# Patient Record
Sex: Female | Born: 1980 | Race: White | Hispanic: No | Marital: Single | Smoking: Former smoker
Health system: Southern US, Community
[De-identification: ages and names within clinical notes are randomized; demographics above are authoritative.]

## PROBLEM LIST (undated history)

## (undated) DIAGNOSIS — F988 Other specified behavioral and emotional disorders with onset usually occurring in childhood and adolescence: Secondary | ICD-10-CM

## (undated) DIAGNOSIS — F329 Major depressive disorder, single episode, unspecified: Secondary | ICD-10-CM

## (undated) DIAGNOSIS — F32A Depression, unspecified: Secondary | ICD-10-CM

## (undated) DIAGNOSIS — O020 Blighted ovum and nonhydatidiform mole: Secondary | ICD-10-CM

## (undated) DIAGNOSIS — F419 Anxiety disorder, unspecified: Secondary | ICD-10-CM

## (undated) HISTORY — PX: CHOLECYSTECTOMY: SHX55

## (undated) HISTORY — PX: HYSTEROSCOPY WITH D & C: SHX1775

---
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
## 2018-04-15 DIAGNOSIS — F411 Generalized anxiety disorder: Secondary | ICD-10-CM | POA: Insufficient documentation

## 2018-04-15 DIAGNOSIS — F32A Depression, unspecified: Secondary | ICD-10-CM | POA: Insufficient documentation

## 2018-04-15 DIAGNOSIS — F988 Other specified behavioral and emotional disorders with onset usually occurring in childhood and adolescence: Secondary | ICD-10-CM | POA: Insufficient documentation

## 2018-04-15 DIAGNOSIS — K580 Irritable bowel syndrome with diarrhea: Secondary | ICD-10-CM | POA: Insufficient documentation

## 2018-12-07 ENCOUNTER — Encounter: Payer: Self-pay | Admitting: Emergency Medicine

## 2018-12-07 ENCOUNTER — Other Ambulatory Visit: Payer: Self-pay

## 2018-12-07 ENCOUNTER — Emergency Department
Admission: EM | Admit: 2018-12-07 | Discharge: 2018-12-07 | Disposition: A | Discharge status: Home / Self Care | Payer: BLUE CROSS/BLUE SHIELD | Source: Home / Self Care | Attending: Family Medicine | Admitting: Family Medicine

## 2018-12-07 DIAGNOSIS — K029 Dental caries, unspecified: Secondary | ICD-10-CM

## 2018-12-07 DIAGNOSIS — K0889 Other specified disorders of teeth and supporting structures: Secondary | ICD-10-CM

## 2018-12-07 DIAGNOSIS — K047 Periapical abscess without sinus: Secondary | ICD-10-CM

## 2018-12-07 HISTORY — DX: Depression, unspecified: F32.A

## 2018-12-07 HISTORY — DX: Other specified behavioral and emotional disorders with onset usually occurring in childhood and adolescence: F98.8

## 2018-12-07 HISTORY — DX: Anxiety disorder, unspecified: F41.9

## 2018-12-07 MED ORDER — CLINDAMYCIN HCL 300 MG PO CAPS: ORAL_CAPSULE | ORAL | 0 refills | Status: DC

## 2018-12-07 NOTE — Discharge Instructions (Addendum)
May continue Ibuprofen 200mg, 4 tabs every 8 hours with food.  °If symptoms become significantly worse during the night or over the weekend, proceed to the local emergency room.  °

## 2018-12-07 NOTE — ED Provider Notes (Signed)
[name]    CSN: [id number] Arrival date & time: 12/07/18  1134     History   Chief Complaint Chief Complaint  Patient presents with  . Dental Pain    HPI Alisha Grant is a 38 y.o. female.   Patient complains of one day history of pain in teeth in her right lower jaw.  She has had mild swelling in her face but no trismus, difficulty swallowing, of fevers, chills, and sweats.  She had similar tooth pain approximately two months ago, treated in the [facility] ED with clindamycin.  Her symptoms improved rapidly, but she did not seek follow-up care with a dentist.  The history is provided by the patient.  Dental Pain  Location:  Lower Lower teeth location:  27/RL cuspid, 28/RL 1st bicuspid and 29/RL 2nd bicuspid Quality:  Aching, constant and dull Severity:  Moderate Onset quality:  Sudden Duration:  1 day Timing:  Constant Progression:  Worsening Chronicity:  Recurrent Context: dental caries, dental fracture and poor dentition   Relieved by:  Nothing Worsened by:  Cold food/drink, hot food/drink and touching Ineffective treatments:  NSAIDs Associated symptoms: facial pain   Associated symptoms: no congestion, no difficulty swallowing, no drooling, no facial swelling, no fever, no gum swelling, no headaches, no neck pain, no neck swelling, no oral bleeding, no oral lesions and no trismus   Risk factors: lack of dental care and periodontal disease     Past Medical History:  Diagnosis Date  . ADD (attention deficit disorder)   . Anxiety and depression     There are no active problems to display for this patient.   Past Surgical History:  Procedure Laterality Date  . CHOLECYSTECTOMY      OB History   No obstetric history on file.      Home Medications    Prior to Admission medications   Medication Sig Start Date End Date Taking? Authorizing Provider  drospirenone-ethinyl estradiol (YAZ) 3-0.02 MG tablet Take 1 tablet by mouth  daily.   Yes [provider]  lisdexamfetamine (VYVANSE) 70 MG capsule Take 70 mg by mouth daily.   Yes [provider]  sertraline (ZOLOFT) 100 MG tablet Take 100 mg by mouth daily.   Yes [provider]  clindamycin (CLEOCIN) 300 MG capsule Take one cap PO every 8 hours 12/07/18   Lattie Haw, MD    Family History No family history on file.  Social History Social History   Tobacco Use  . Smoking status: Current Every Day Smoker  . Smokeless tobacco: Never Used  Substance Use Topics  . Alcohol use: Yes  . Drug use: Not on file     Allergies   Codeine and Penicillins   Review of Systems Review of Systems  Constitutional: Negative for chills, diaphoresis and fever.  HENT: Negative for congestion, drooling, facial swelling and mouth sores.   Musculoskeletal: Negative for neck pain.  Neurological: Negative for headaches.  All other systems reviewed and are negative.    Physical Exam Triage Vital Signs ED Triage Vitals  Enc Vitals Group     BP 12/07/18 1217 105/72     Pulse Rate 12/07/18 1217 100     Resp 12/07/18 1217 16     Temp 12/07/18 1217 98.6 F (37 C)     Temp src --      SpO2 12/07/18 1217 100 %     Weight 12/07/18 1218 215 lb (97.5 kg)  Height 12/07/18 1218 5' (1.524 m)     Head Circumference --      Peak Flow --      Pain Score 12/07/18 1217 9     Pain Loc --      Pain Edu? --      Excl. in [place]? --    No data found.  Updated Vital Signs BP 105/72 (BP Location: Right Arm)   Pulse 100   Temp 98.6 F (37 C)   Resp 16   Ht 5' (1.524 m)   Wt 97.5 kg   SpO2 100%   BMI 41.99 kg/m   Visual Acuity Right Eye Distance:   Left Eye Distance:   Bilateral Distance:    Right Eye Near:   Left Eye Near:    Bilateral Near:     Physical Exam Vitals signs and nursing note reviewed.  Constitutional:      General: She is not in acute distress. HENT:     Head: Normocephalic.     Jaw: Tenderness present.     Salivary  Glands: Right salivary gland is not tender. Left salivary gland is not tender.      Comments: There is tenderness to palpation right cheek as noted on diagram, but no swelling.     Right Ear: Tympanic membrane normal.     Left Ear: Tympanic membrane normal.     Nose: Nose normal.     Mouth/Throat:     Mouth: Mucous membranes are moist. No oral lesions.     Dentition: Abnormal dentition. Dental tenderness and dental caries present. No gingival swelling, dental abscesses or gum lesions.     Tongue: No lesions. Tongue does not deviate from midline.     Palate: No lesions.     Pharynx: Oropharynx is clear.      Comments: Diffuse dental caries present. Tooth #27 severely eroded and tender to tap. Teeth #28 & 29 completely eroded and tender to tap. Eyes:     Conjunctiva/sclera: Conjunctivae normal.     Pupils: Pupils are equal, round, and reactive to light.  Cardiovascular:     Heart sounds: Normal heart sounds.  Pulmonary:     Breath sounds: Normal breath sounds.  Lymphadenopathy:     Cervical: No cervical adenopathy.  Skin:    General: Skin is warm and dry.  Neurological:     Mental Status: She is alert and oriented to person, place, and time.      UC Treatments / Results  Labs (all labs ordered are listed, but only abnormal results are displayed) Labs Reviewed - No data to display  EKG None  Radiology No results found.  Procedures Procedures (including critical care time)  Medications Ordered in UC Medications - No data to display  Initial Impression / Assessment and Plan / UC Course  I have reviewed the triage vital signs and the nursing notes.  Pertinent labs & imaging results that were available during my care of the patient were reviewed by me and considered in my medical decision making (see chart for details).    Begin Clindamycin 300mg  Q8hr. Followup with dentist as soon as possible.    Final Clinical Impressions(s) / UC Diagnoses   Final diagnoses:   Pain, dental  Dental caries  Dental infection     Discharge Instructions     May continue Ibuprofen 200mg , 4 tabs every 8 hours with food.   If symptoms become significantly worse during the night or over the weekend, proceed to the  local emergency room.     ED Prescriptions    Medication Sig Dispense Auth. Provider   clindamycin (CLEOCIN) 300 MG capsule Take one cap PO every 8 hours 21 capLattie Hawtephen A, MD        Lattie Haw, MD 12/07/18 2033

## 2018-12-07 NOTE — ED Triage Notes (Signed)
Patient reports severe pain in lower right jaw area around teeth; has dental carries and missing teeth; she took ibuprofen 800mg  at 1100. She has not travelled.

## 2019-04-07 ENCOUNTER — Other Ambulatory Visit: Payer: Self-pay

## 2019-04-07 ENCOUNTER — Encounter: Payer: Self-pay | Admitting: Family Medicine

## 2019-04-07 ENCOUNTER — Emergency Department (INDEPENDENT_AMBULATORY_CARE_PROVIDER_SITE_OTHER)
Admission: EM | Admit: 2019-04-07 | Discharge: 2019-04-07 | Disposition: A | Discharge status: Home / Self Care | Payer: BLUE CROSS/BLUE SHIELD | Source: Home / Self Care

## 2019-04-07 DIAGNOSIS — J40 Bronchitis, not specified as acute or chronic: Secondary | ICD-10-CM

## 2019-04-07 DIAGNOSIS — J4521 Mild intermittent asthma with (acute) exacerbation: Secondary | ICD-10-CM

## 2019-04-07 MED ORDER — DEXAMETHASONE SODIUM PHOSPHATE 10 MG/ML IJ SOLN
10.0000 mg | Freq: Once | INTRAMUSCULAR | Status: AC
  Administered 2019-04-07: 10 mg via INTRAMUSCULAR

## 2019-04-07 MED ORDER — AZITHROMYCIN 250 MG PO TABS: ORAL_TABLET | ORAL | 0 refills | Status: DC

## 2019-04-07 NOTE — ED Provider Notes (Addendum)
[facility]    CSN: [id number] Arrival date & time: 04/07/19  1805      History   Chief Complaint Chief Complaint  Patient presents with  . URI    HPI Alisha Grant is a 38 y.o. female.   This is a 38 year old established [facility] urgent care patient who presents with cough and runny nose.  She has been out of work for 5  Months and living alone except for boyfriend.  No fever.  Patient has a h/o asthma.  She is complaining of sinus pressure and congestion.  No nausea, loss of sense of smell, myalgias.     Past Medical History:  Diagnosis Date  . ADD (attention deficit disorder)   . Anxiety and depression     There are no active problems to display for this patient.   Past Surgical History:  Procedure Laterality Date  . CHOLECYSTECTOMY      OB History   No obstetric history on file.      Home Medications    Prior to Admission medications   Medication Sig Start Date End Date Taking? Authorizing Provider  azithromycin (ZITHROMAX) 250 MG tablet Take 2 tabs PO x 1 dose, then 1 tab PO QD x 4 days 04/07/19   Robyn Haber, MD  drospirenone-ethinyl estradiol (YAZ) 3-0.02 MG tablet Take 1 tablet by mouth daily.    [provider]  lisdexamfetamine (VYVANSE) 70 MG capsule Take 70 mg by mouth daily.    [provider]  sertraline (ZOLOFT) 100 MG tablet Take 100 mg by mouth daily.    [provider]    Family History Family History  Problem Relation Age of Onset  . Cancer Mother   . COPD Mother   . Heart attack Father     Social History Social History   Tobacco Use  . Smoking status: Current Every Day Smoker  . Smokeless tobacco: Never Used  Substance Use Topics  . Alcohol use: Yes  . Drug use: Not on file     Allergies   Codeine and Penicillins   Review of Systems Review of Systems  Constitutional: Negative.   HENT: Positive for congestion and rhinorrhea.   Respiratory: Positive for cough and  wheezing.   All other systems reviewed and are negative.    Physical Exam Triage Vital Signs ED Triage Vitals  Enc Vitals Group     BP      Pulse      Resp      Temp      Temp src      SpO2      Weight      Height      Head Circumference      Peak Flow      Pain Score      Pain Loc      Pain Edu?      Excl. in [place]?    No data found.  Updated Vital Signs BP 122/76 (BP Location: Right Arm)   Pulse (!) 110   Temp 99.1 F (37.3 C) (Oral)   Ht 5' (1.524 m)   Wt 93.4 kg   SpO2 98%   BMI 40.23 kg/m    Physical Exam Vitals signs and nursing note reviewed.  Constitutional:      Appearance: Normal appearance. She is obese.  HENT:     Head: Normocephalic.     Right Ear: Tympanic membrane normal.     Left Ear: Tympanic membrane normal.  Nose: Congestion present.     Mouth/Throat:     Pharynx: Oropharynx is clear. Posterior oropharyngeal erythema present.  Eyes:     Conjunctiva/sclera: Conjunctivae normal.  Neck:     Musculoskeletal: Normal range of motion and neck supple.  Cardiovascular:     Rate and Rhythm: Normal rate and regular rhythm.     Heart sounds: Normal heart sounds. No murmur.  Pulmonary:     Effort: Pulmonary effort is normal.     Breath sounds: Wheezing and rhonchi present.  Musculoskeletal: Normal range of motion.  Skin:    General: Skin is warm and dry.  Neurological:     General: No focal deficit present.     Mental Status: She is alert and oriented to person, place, and time.  Psychiatric:        Mood and Affect: Mood normal.      UC Treatments / Results  Labs (all labs ordered are listed, but only abnormal results are displayed) Labs Reviewed - No data to display  EKG   Radiology No results found.  Procedures Procedures (including critical care time)  Medications Ordered in UC Medications  dexamethasone (DECADRON) injection 10 mg (has no administration in time range)    Initial Impression / Assessment and Plan / UC  Course  I have reviewed the triage vital signs and the nursing notes.  Pertinent labs & imaging results that were available during my care of the patient were reviewed by me and considered in my medical decision making (see chart for details).    Final Clinical Impressions(s) / UC Diagnoses   Final diagnoses:  Bronchitis  Mild intermittent asthma with acute exacerbation   Discharge Instructions   None    ED Prescriptions    Medication Sig Dispense Auth. Provider   azithromycin (ZITHROMAX) 250 MG tablet Take 2 tabs PO x 1 dose, then 1 tab PO QD x 4 days 6 tablet Elvina SidleLauenstein, Aleisa Howk, MD     Controlled Substance Prescriptions [place] Controlled Substance Registry consulted? Not Applicable   Elvina SidleLauenstein, Randall Colden, MD 04/07/19 Francesco [id number]    Alisha Bonk, MD 04/07/19 [phone]

## 2019-04-07 NOTE — ED Triage Notes (Signed)
Runny nose, productive cough, scratchy throat since yesterday

## 2019-04-10 ENCOUNTER — Telehealth: Payer: Self-pay | Admitting: Family Medicine

## 2019-04-10 MED ORDER — PREDNISONE 20 MG PO TABS: ORAL_TABLET | ORAL | 0 refills | Status: DC

## 2019-04-10 NOTE — Telephone Encounter (Signed)
Notified patient that medication was sent to pharmacy.  

## 2019-04-10 NOTE — Telephone Encounter (Signed)
Patient reports that she continues to cough, although improved.  She has not yet finished Z-pak. Plan: Begin prednisone burst/taper.  Finish Z-pak.

## 2019-04-15 ENCOUNTER — Telehealth: Payer: Self-pay | Admitting: Emergency Medicine

## 2019-04-15 NOTE — Telephone Encounter (Signed)
Patient called to see only minimal improvement; suggested she see her pcp or return for evaluation since last seen 04-07-19.

## 2019-04-24 ENCOUNTER — Emergency Department (INDEPENDENT_AMBULATORY_CARE_PROVIDER_SITE_OTHER)
Admission: EM | Admit: 2019-04-24 | Discharge: 2019-04-24 | Disposition: A | Discharge status: Home / Self Care | Payer: Self-pay | Source: Home / Self Care | Attending: Emergency Medicine | Admitting: Emergency Medicine

## 2019-04-24 ENCOUNTER — Emergency Department (INDEPENDENT_AMBULATORY_CARE_PROVIDER_SITE_OTHER): Payer: 59

## 2019-04-24 ENCOUNTER — Other Ambulatory Visit: Payer: Self-pay

## 2019-04-24 DIAGNOSIS — M25561 Pain in right knee: Secondary | ICD-10-CM

## 2019-04-24 DIAGNOSIS — M7651 Patellar tendinitis, right knee: Secondary | ICD-10-CM

## 2019-04-24 MED ORDER — MELOXICAM 7.5 MG PO TABS: ORAL_TABLET | ORAL | 0 refills | Status: DC

## 2019-04-24 NOTE — ED Provider Notes (Signed)
[facility]    CSN: [id number] Arrival date & time: 04/24/19  1052      History   Chief Complaint Chief Complaint  Patient presents with  . Knee Pain    HPI Alisha Grant is a 38 y.o. female.   HPI Patient took a new position last week at [employer] last week.  She has had to go up and down steps most of the day for the last 8 days.  She has pain and discomfort in her anterior right knee.  She had a previous injury to her knee 12 years ago.  At that time she wore a brace.  She has had no fever chills.  She has had no noticeable swelling.  The knee has not given way or locked on her.  She has been forced to walk with a limp. Past Medical History:  Diagnosis Date  . ADD (attention deficit disorder)   . Anxiety and depression     There are no active problems to display for this patient.   Past Surgical History:  Procedure Laterality Date  . CHOLECYSTECTOMY      OB History   No obstetric history on file.      Home Medications    Prior to Admission medications   Medication Sig Start Date End Date Taking? Authorizing Provider  azithromycin (ZITHROMAX) 250 MG tablet Take 2 tabs PO x 1 dose, then 1 tab PO QD x 4 days 04/07/19   Robyn Haber, MD  drospirenone-ethinyl estradiol (YAZ) 3-0.02 MG tablet Take 1 tablet by mouth daily.    [provider]  lisdexamfetamine (VYVANSE) 70 MG capsule Take 70 mg by mouth daily.    [provider]  meloxicam (MOBIC) 7.5 MG tablet Take 1 to 2 tablets daily for knee discomfort. 04/24/19   Darlyne Russian, MD  predniSONE (DELTASONE) 20 MG tablet Take one tab by mouth twice daily for 4 days, then one daily. Take with food. 04/10/19   Kandra Nicolas, MD  sertraline (ZOLOFT) 100 MG tablet Take 100 mg by mouth daily.    [provider]    Family History Family History  Problem Relation Age of Onset  . Cancer Mother   . COPD Mother   . Heart attack Father     Social History Social  History   Tobacco Use  . Smoking status: Current Every Day Smoker  . Smokeless tobacco: Never Used  Substance Use Topics  . Alcohol use: Yes  . Drug use: Not on file     Allergies   Codeine and Penicillins   Review of Systems Review of Systems  Constitutional: Negative.   Musculoskeletal: Positive for gait problem. Negative for back pain.       She has pain in the right knee without locking or giving way     Physical Exam Triage Vital Signs ED Triage Vitals  Enc Vitals Group     BP 04/24/19 1117 122/85     Pulse Rate 04/24/19 1117 (!) 103     Resp 04/24/19 1117 20     Temp 04/24/19 1117 98 F (36.7 C)     Temp Source 04/24/19 1117 Oral     SpO2 04/24/19 1117 97 %     Weight 04/24/19 1118 206 lb (93.4 kg)     Height 04/24/19 1118 5' (1.524 m)     Head Circumference --      Peak Flow --      Pain Score 04/24/19 1118  6     Pain Loc --      Pain Edu? --      Excl. in GC? --    No data found.  Updated Vital Signs BP 122/85 (BP Location: Right Arm)   Pulse (!) 103   Temp 98 F (36.7 C) (Oral)   Resp 20   Ht 5' (1.524 m)   Wt 93.4 kg   SpO2 97%   BMI 40.23 kg/m   Visual Acuity Right Eye Distance:   Left Eye Distance:   Bilateral Distance:    Right Eye Near:   Left Eye Near:    Bilateral Near:     Physical Exam Constitutional:      Comments: Patient is overweight and had difficulty getting up on the exam table.  Cardiovascular:     Rate and Rhythm: Normal rate.  Pulmonary:     Effort: Pulmonary effort is normal.  Musculoskeletal:     Comments: There is tenderness over the head of the fibula as well as tenderness over the patellar tendon with crepitation.  There is pain with extension of the right lower leg against resistance.  There is no ankle edema pulses in the right lower extremity are normal.  Neurological:     Mental Status: She is alert.      UC Treatments / Results  Labs (all labs ordered are listed, but only abnormal results are  displayed) Labs Reviewed - No data to display  EKG   Radiology Dg Knee Ap/lat W/sunrise Right  Result Date: 04/24/2019 CLINICAL DATA:  Tenderness and crepitation over the patellar region of the right knee. Patient tripped over her dog and fell. Anterior bruising. EXAM: RIGHT KNEE 3 VIEWS COMPARISON:  None. FINDINGS: No fracture or bone lesion. Joint normally spaced and aligned. No arthropathic changes. No joint effusion. Mild anterior subcutaneous soft tissue edema above the patella. No other soft tissue abnormality. IMPRESSION: No fracture or joint abnormality. Electronically Signed   By: Amie Portlandavid  Ormond M.D.   On: 04/24/2019 12:08    Procedures Procedures (including critical care time)  Medications Ordered in UC Medications - No data to display  Initial Impression / Assessment and Plan / UC Course  I have reviewed the triage vital signs and the nursing notes. I suspect the patient has developed a tendinitis of the patellar tendon.  She is overweight and has been going up and down steps.  There was crepitation over the tendon with flexion extension at the knee joint.  Will treat with anti-inflammatory medication as well as ice and rest.  I will give her a note to be excused from walking up and down steps for the next 2 weeks.  X-rays will be checked.  X-rays done were negative.  She felt much better in a knee brace.  She was given a note for light duty for a couple of weeks.  She was also instructed to be aware of GI upset and to stop the meloxicam if this develops. Pertinent labs & imaging results that were available during my care of the patient were reviewed by me and considered in my medical decision making (see chart for details).      Final Clinical Impressions(s) / UC Diagnoses   Final diagnoses:  Acute pain of right knee  Patellar tendinitis, right knee     Discharge Instructions     Take anti-inflammatory daily. If you develop any gastrointestinal upset stop your  medication and call the office. Apply ice to your knee twice  a day. Avoid steps for the next 2 weeks. Wear brace as needed.    ED Prescriptions    Medication Sig Dispense Auth. Provider   meloxicam (MOBIC) 7.5 MG tablet Take 1 to 2 tablets daily for knee discomfort. 20 tablet Collene Gobble, MD     PDMP not reviewed this encounter.   Collene Gobble, MD 04/24/19 1300

## 2019-04-24 NOTE — Discharge Instructions (Addendum)
Take anti-inflammatory daily. If you develop any gastrointestinal upset stop your medication and call the office. Apply ice to your knee twice a day. Avoid steps for the next 2 weeks. Wear brace as needed.

## 2019-04-24 NOTE — ED Triage Notes (Signed)
Pt fell on knee a couple years ago, and it "bothers" her at times.  She started a new job last Thursday, and is going up and down stairs, and on her feet all day, and the right knee has been causing pain for the last 24 or so hours.

## 2019-05-26 ENCOUNTER — Other Ambulatory Visit: Payer: Self-pay

## 2019-05-26 ENCOUNTER — Encounter: Payer: Self-pay | Admitting: *Deleted

## 2019-05-26 ENCOUNTER — Emergency Department
Admission: EM | Admit: 2019-05-26 | Discharge: 2019-05-26 | Disposition: A | Discharge status: Home / Self Care | Payer: Medicaid Other | Source: Home / Self Care | Attending: Family Medicine | Admitting: Family Medicine

## 2019-05-26 DIAGNOSIS — J029 Acute pharyngitis, unspecified: Secondary | ICD-10-CM

## 2019-05-26 DIAGNOSIS — J069 Acute upper respiratory infection, unspecified: Secondary | ICD-10-CM

## 2019-05-26 LAB — POCT RAPID STREP A (OFFICE): Rapid Strep A Screen: NEGATIVE

## 2019-05-26 MED ORDER — DOXYCYCLINE HYCLATE 100 MG PO CAPS: 100.0000 mg | ORAL_CAPSULE | Freq: Two times a day (BID) | ORAL | 0 refills | Status: DC

## 2019-05-26 MED ORDER — PREDNISONE 20 MG PO TABS: ORAL_TABLET | ORAL | 0 refills | Status: DC

## 2019-05-26 NOTE — ED Provider Notes (Signed)
[name]    CSN: [id number] Arrival date & time: 05/26/19  1306      History   Chief Complaint Chief Complaint  Patient presents with  . Sore Throat    HPI Alisha Grant is a 38 y.o. female.   Yesterday patient developed a scratchy throat, nasal congestion, and dry cough.  She has been mildly fatigued but denies fever, chest tightness/pain, shortness of breath, and changes in taste/smell.  She has a history of asthma and seasonal rhinitis, but has no improvement after starting Allegra yesterday.  She continues to smoke, and has had pneumonia in the past.  The history is provided by the patient.    Past Medical History:  Diagnosis Date  . ADD (attention deficit disorder)   . Anxiety and depression     There are no active problems to display for this patient.   Past Surgical History:  Procedure Laterality Date  . CHOLECYSTECTOMY      OB History   No obstetric history on file.      Home Medications    Prior to Admission medications   Medication Sig Start Date End Date Taking? Authorizing Provider  doxycycline (VIBRAMYCIN) 100 MG capsule Take 1 capsule (100 mg total) by mouth 2 (two) times daily. Take with food (Rx void after 06/02/19) 05/26/19   Lattie Haw, MD  drospirenone-ethinyl estradiol (YAZ) 3-0.02 MG tablet Take 1 tablet by mouth daily.    [provider]  lisdexamfetamine (VYVANSE) 70 MG capsule Take 70 mg by mouth daily.    [provider]  predniSONE (DELTASONE) 20 MG tablet Take one tab by mouth twice daily for 4 days, then one daily. Take with food. 05/26/19   Lattie Haw, MD  sertraline (ZOLOFT) 100 MG tablet Take 100 mg by mouth daily.    [provider]    Family History Family History  Problem Relation Age of Onset  . Cancer Mother   . COPD Mother   . Heart attack Father     Social History Social History   Tobacco Use  . Smoking status: Current Every Day Smoker  . Smokeless  tobacco: Never Used  Substance Use Topics  . Alcohol use: Yes  . Drug use: Not on file     Allergies   Codeine and Penicillins   Review of Systems Review of Systems + sore throat + cough No pleuritic pain No wheezing + nasal congestion + post-nasal drainage + sinus pain/pressure No itchy/red eyes No earache No hemoptysis No SOB No fever/chills No nausea No vomiting No abdominal pain No diarrhea No urinary symptoms No skin rash + fatigue No myalgias No headache Used OTC meds without relief   Physical Exam Triage Vital Signs ED Triage Vitals [05/26/19 1325]  Enc Vitals Group     BP (!) 142/94     Pulse Rate 87     Resp 16     Temp 98.4 F (36.9 C)     Temp Source Oral     SpO2 100 %     Weight 205 lb (93 kg)     Height 5' (1.524 m)     Head Circumference      Peak Flow      Pain Score 0     Pain Loc      Pain Edu?      Excl. in GC?    No data found.  Updated Vital Signs BP (!) 142/94 (BP Location: Right Arm)  Pulse 87   Temp 98.4 F (36.9 C) (Oral)   Resp 16   Ht 5' (1.524 m)   Wt 93 kg   SpO2 100%   BMI 40.04 kg/m   Visual Acuity Right Eye Distance:   Left Eye Distance:   Bilateral Distance:    Right Eye Near:   Left Eye Near:    Bilateral Near:     Physical Exam Nursing notes and Vital Signs reviewed. Appearance:  Patient appears stated age, and in no acute distress Eyes:  Pupils are equal, round, and reactive to light and accomodation.  Extraocular movement is intact.  Conjunctivae are not inflamed  Ears:  Canals normal.  Tympanic membranes normal.  Nose:  Mildly congested turbinates.  Maxillary sinus tenderness is present.  Pharynx:  Normal Neck:  Supple.  Shotty posterior/lateral nodes are palpated bilaterally, tender to palpation on the left.   Lungs:  Clear to auscultation.  Breath sounds are equal.  Moving air well. Heart:  Regular rate and rhythm without murmurs, rubs, or gallops.  Abdomen:  Nontender without masses or  hepatosplenomegaly.  Bowel sounds are present.  No CVA or flank tenderness.  Extremities:  No edema.  Skin:  No rash present.    UC Treatments / Results  Labs (all labs ordered are listed, but only abnormal results are displayed) Labs Reviewed  STREP A DNA PROBE  POCT RAPID STREP A (OFFICE)    EKG   Radiology No results found.  Procedures Procedures (including critical care time)  Medications Ordered in UC Medications - No data to display  Initial Impression / Assessment and Plan / UC Course  I have reviewed the triage vital signs and the nursing notes.  Pertinent labs & imaging results that were available during my care of the patient were reviewed by me and considered in my medical decision making (see chart for details).    There is no evidence of bacterial infection today.  Treat symptomatically for now  Begin prednisone burst/taper.  Because of her smoking history and past history of pneumonia, given an Rx to hold for doxycycline. Followup with Family Doctor if not improved in about 10 days.   Final Clinical Impressions(s) / UC Diagnoses   Final diagnoses:  Acute pharyngitis, unspecified etiology  Viral URI with cough     Discharge Instructions     Take plain guaifenesin (  extended release tabs such as Mucinex) twice daily, with plenty of water, for cough and congestion. Get adequate rest.   May use Afrin nasal spray (or generic oxymetazoline) each morning for about 5 days and then discontinue.  Also recommend using saline nasal spray several times daily and saline nasal irrigation (AYR is a common brand).  Use Flonase nasal spray each morning after using Afrin nasal spray and saline nasal irrigation. Try warm salt water gargles for sore throat.  Stop all antihistamines for now, and other non-prescription cough/cold preparations. Use albuterol inhaler as needed. May take Delsym Cough Suppressant at bedtime for nighttime cough.  Begin doxycycline if not  improving about one week or if persistent fever develops  (Given a prescription to hold, with an expiration date)     If symptoms become significantly worse during the night or over the weekend, proceed to the local emergency room.            ED Prescriptions    Medication Sig Dispense Auth. Provider   predniSONE (DELTASONE) 20 MG tablet Take one tab by mouth twice daily for 4 days,  then one daily. Take with food. 12 tablet Kandra Nicolas, MD   doxycycline (VIBRAMYCIN) 100 MG capsule Take 1 capsule (100 mg total) by mouth 2 (two) times daily. Take with food (Rx void after 06/02/19) 14 capsule Kandra Nicolas, MD        Kandra Nicolas, MD 05/26/19 [phone]

## 2019-05-26 NOTE — ED Triage Notes (Signed)
Pt c/o sore throat and facial pain x 2 days. Denies fever or SOB. Reports mild dry cough.

## 2019-05-26 NOTE — Discharge Instructions (Addendum)
Take plain guaifenesin (1200mg  extended release tabs such as Mucinex) twice daily, with plenty of water, for cough and congestion. Get adequate rest.   May use Afrin nasal spray (or generic oxymetazoline) each morning for about 5 days and then discontinue.  Also recommend using saline nasal spray several times daily and saline nasal irrigation (AYR is a common brand).  Use Flonase nasal spray each morning after using Afrin nasal spray and saline nasal irrigation. Try warm salt water gargles for sore throat.  Stop all antihistamines for now, and other non-prescription cough/cold preparations. Use albuterol inhaler as needed. May take Delsym Cough Suppressant at bedtime for nighttime cough.  Begin doxycycline if not improving about one week or if persistent fever develops   If symptoms become significantly worse during the night or over the weekend, proceed to the local emergency room.

## 2019-05-27 LAB — STREP A DNA PROBE: Group A Strep Probe: NOT DETECTED

## 2019-06-17 ENCOUNTER — Encounter: Payer: Self-pay | Admitting: Emergency Medicine

## 2019-06-17 ENCOUNTER — Other Ambulatory Visit: Payer: Self-pay

## 2019-06-17 ENCOUNTER — Emergency Department
Admission: EM | Admit: 2019-06-17 | Discharge: 2019-06-17 | Disposition: A | Discharge status: Home / Self Care | Payer: Medicaid Other | Source: Home / Self Care

## 2019-06-17 DIAGNOSIS — J029 Acute pharyngitis, unspecified: Secondary | ICD-10-CM | POA: Diagnosis not present

## 2019-06-17 DIAGNOSIS — Z6839 Body mass index (BMI) 39.0-39.9, adult: Secondary | ICD-10-CM | POA: Insufficient documentation

## 2019-06-17 DIAGNOSIS — Z20822 Contact with and (suspected) exposure to covid-19: Secondary | ICD-10-CM

## 2019-06-17 DIAGNOSIS — Z20828 Contact with and (suspected) exposure to other viral communicable diseases: Secondary | ICD-10-CM

## 2019-06-17 LAB — POCT RAPID STREP A (OFFICE): Rapid Strep A Screen: NEGATIVE

## 2019-06-17 NOTE — ED Triage Notes (Signed)
Sore throat since yesterday.

## 2019-06-17 NOTE — ED Provider Notes (Signed)
[facility]    CSN: [id number] Arrival date & time: 06/17/19  0858      History   Chief Complaint Chief Complaint  Patient presents with  . Sore Throat    HPI Alisha Grant is a 38 y.o. female.   HPI Alisha Grant is a 38 y.o. female presenting to UC with c/o a mild sore throat that started yesterday and mild nasal congestion.  Pt states her mother has also been sick recently with congestion, sore throat and low-grade fever. Her mother is currently being tested for Covid-19. Pt notes her aunt did test positive recently for Covid-19. Pt has not been around her aunt but has been in her house this past weekend.  She was also around her mother and states her son has had a fever and mild congestion but has not been tested for Covid yet.  Pt denies fever ,chills, n/v/d. No cough or chest congestion.    Past Medical History:  Diagnosis Date  . ADD (attention deficit disorder)   . Anxiety and depression     There are no active problems to display for this patient.   Past Surgical History:  Procedure Laterality Date  . CHOLECYSTECTOMY      OB History   No obstetric history on file.      Home Medications    Prior to Admission medications   Medication Sig Start Date End Date Taking? Authorizing Provider  lisdexamfetamine (VYVANSE) 70 MG capsule Take 70 mg by mouth daily.    [provider]  sertraline (ZOLOFT) 100 MG tablet Take 100 mg by mouth daily.    [provider]    Family History Family History  Problem Relation Age of Onset  . Cancer Mother   . COPD Mother   . Heart attack Father     Social History Social History   Tobacco Use  . Smoking status: Current Every Day Smoker    Types: Cigarettes  . Smokeless tobacco: Never Used  Substance Use Topics  . Alcohol use: Yes  . Drug use: Not on file     Allergies   Codeine and Penicillins   Review of Systems Review of Systems  Constitutional: Negative for  chills and fever.  HENT: Positive for congestion (mild) and sore throat. Negative for ear pain, trouble swallowing and voice change.   Respiratory: Negative for cough and shortness of breath.   Cardiovascular: Negative for chest pain and palpitations.  Gastrointestinal: Negative for abdominal pain, diarrhea, nausea and vomiting.  Musculoskeletal: Negative for arthralgias, back pain and myalgias.  Skin: Negative for rash.  Neurological: Negative for dizziness, light-headedness and headaches.     Physical Exam Triage Vital Signs ED Triage Vitals  Enc Vitals Group     BP 06/17/19 0919 115/79     Pulse Rate 06/17/19 0919 82     Resp --      Temp 06/17/19 0919 98.1 F (36.7 C)     Temp Source 06/17/19 0919 Oral     SpO2 06/17/19 0919 100 %     Weight 06/17/19 0920 201 lb (91.2 kg)     Height 06/17/19 0920 5' (1.524 m)     Head Circumference --      Peak Flow --      Pain Score 06/17/19 0920 5     Pain Loc --      Pain Edu? --      Excl. in [place]? --    No data found.  Updated Vital Signs BP 115/79 (BP Location: Right Arm)   Pulse 82   Temp 98.1 F (36.7 C) (Oral)   Ht 5' (1.524 m)   Wt 201 lb (91.2 kg)   SpO2 100%   BMI 39.26 kg/m   Visual Acuity Right Eye Distance:   Left Eye Distance:   Bilateral Distance:    Right Eye Near:   Left Eye Near:    Bilateral Near:     Physical Exam Vitals signs and nursing note reviewed.  Constitutional:      Appearance: She is well-developed.  HENT:     Head: Normocephalic and atraumatic.     Right Ear: Tympanic membrane and ear canal normal.     Left Ear: Tympanic membrane and ear canal normal.     Nose: Nose normal.     Right Sinus: No maxillary sinus tenderness or frontal sinus tenderness.     Left Sinus: No maxillary sinus tenderness or frontal sinus tenderness.     Mouth/Throat:     Lips: Pink.     Mouth: Mucous membranes are moist.     Pharynx: Oropharynx is clear. Uvula midline. Posterior oropharyngeal erythema  present. No pharyngeal swelling or uvula swelling.     Tonsils: No tonsillar exudate.  Neck:     Musculoskeletal: Normal range of motion and neck supple.  Cardiovascular:     Rate and Rhythm: Normal rate and regular rhythm.  Pulmonary:     Effort: Pulmonary effort is normal. No respiratory distress.     Breath sounds: Normal breath sounds.  Musculoskeletal: Normal range of motion.  Lymphadenopathy:     Cervical: No cervical adenopathy.  Skin:    General: Skin is warm and dry.  Neurological:     Mental Status: She is alert and oriented to person, place, and time.  Psychiatric:        Behavior: Behavior normal.      UC Treatments / Results  Labs (all labs ordered are listed, but only abnormal results are displayed) Labs Reviewed  SARS-COV-2 RNA,(COVID-19) QUALITATIVE NAAT  STREP A DNA PROBE  POCT RAPID STREP A (OFFICE)    EKG   Radiology No results found.  Procedures Procedures (including critical care time)  Medications Ordered in UC Medications - No data to display  Initial Impression / Assessment and Plan / UC Course  I have reviewed the triage vital signs and the nursing notes.  Pertinent labs & imaging results that were available during my care of the patient were reviewed by me and considered in my medical decision making (see chart for details).     Rapid strep: NEGATIVE Culture sent Send out Covid-19 testing: pending AVS provided  Final Clinical Impressions(s) / UC Diagnoses   Final diagnoses:  Acute pharyngitis, unspecified etiology  Exposure to COVID-19 virus     Discharge Instructions      You may take 500mg  acetaminophen every 4-6 hours or in combination with ibuprofen 400-600mg  every 6-8 hours as needed for pain, inflammation, and fever.  Be sure to well hydrated with clear liquids and get at least 8 hours of sleep at night, preferably more while sick.   Please follow up with family medicine in 1 week if needed.   Due to concern for  possibly having Covid-19, it is advised that you self-isolate at home until test results come back in 2-3 days.  If positive, it is recommended you stay isolated for at least 10 days after symptom onset and 24 hours after last  fever without taking medication (whichever is longer), with improving symptoms.  If you MUST go out, please wear a mask at all times, limit contact with others.      ED Prescriptions    None     PDMP not reviewed this encounter.   Lurene Shadowhelps, Tarez Bowns O, PA-C 06/17/19 1227

## 2019-06-17 NOTE — Discharge Instructions (Signed)
°  You may take 500mg acetaminophen every 4-6 hours or in combination with ibuprofen 400-600mg every 6-8 hours as needed for pain, inflammation, and fever. ° °Be sure to well hydrated with clear liquids and get at least 8 hours of sleep at night, preferably more while sick.  ° °Please follow up with family medicine in 1 week if needed. ° ° °Due to concern for possibly having Covid-19, it is advised that you self-isolate at home until test results come back in 2-3 days.  If positive, it is recommended you stay isolated for at least 10 days after symptom onset and 24 hours after last fever without taking medication (whichever is longer), with improving symptoms.  If you MUST go out, please wear a mask at all times, limit contact with others.  ° °

## 2019-06-19 LAB — STREP A DNA PROBE: Group A Strep Probe: NOT DETECTED

## 2019-06-21 LAB — SARS-COV-2 RNA,(COVID-19) QUALITATIVE NAAT: SARS CoV2 RNA: NOT DETECTED

## 2019-07-07 ENCOUNTER — Emergency Department
Admission: EM | Admit: 2019-07-07 | Discharge: 2019-07-07 | Disposition: A | Discharge status: Home / Self Care | Payer: 59 | Source: Home / Self Care

## 2019-07-07 ENCOUNTER — Other Ambulatory Visit: Payer: Self-pay

## 2019-07-07 ENCOUNTER — Encounter: Payer: Self-pay | Admitting: Emergency Medicine

## 2019-07-07 DIAGNOSIS — J029 Acute pharyngitis, unspecified: Secondary | ICD-10-CM | POA: Diagnosis not present

## 2019-07-07 LAB — POCT RAPID STREP A (OFFICE): Rapid Strep A Screen: NEGATIVE

## 2019-07-07 NOTE — ED Triage Notes (Signed)
Sore throat, runny nose, cough, started 3 days

## 2019-07-07 NOTE — ED Provider Notes (Signed)
[name]    CSN: [id number] Arrival date & time: 07/07/19  1028      History   Chief Complaint Chief Complaint  Patient presents with  . Sore Throat    HPI Alisha Grant is a 38 y.o. female.   The history is provided by the patient. No language interpreter was used.  Sore Throat This is a new problem. The problem occurs constantly. The problem has not changed since onset.Nothing aggravates the symptoms. Nothing relieves the symptoms. She has tried nothing for the symptoms. The treatment provided no relief.    Past Medical History:  Diagnosis Date  . ADD (attention deficit disorder)   . Anxiety and depression     There are no active problems to display for this patient.   Past Surgical History:  Procedure Laterality Date  . CHOLECYSTECTOMY      OB History   No obstetric history on file.      Home Medications    Prior to Admission medications   Medication Sig Start Date End Date Taking? Authorizing Provider  lisdexamfetamine (VYVANSE) 70 MG capsule Take 70 mg by mouth daily.    [provider]  sertraline (ZOLOFT) 100 MG tablet Take 100 mg by mouth daily.    [provider]    Family History Family History  Problem Relation Age of Onset  . Cancer Mother   . COPD Mother   . Heart attack Father     Social History Social History   Tobacco Use  . Smoking status: Current Every Day Smoker    Packs/day: 1.00    Types: Cigarettes  . Smokeless tobacco: Never Used  Substance Use Topics  . Alcohol use: Yes  . Drug use: Not on file     Allergies   Codeine and Penicillins   Review of Systems Review of Systems  All other systems reviewed and are negative.    Physical Exam Triage Vital Signs ED Triage Vitals  Enc Vitals Group     BP 07/07/19 1118 118/74     Pulse Rate 07/07/19 1118 97     Resp --      Temp 07/07/19 1118 98.6 F (37 C)     Temp Source 07/07/19 1118 Oral     SpO2 07/07/19 1118 99 %   Weight 07/07/19 1119 206 lb (93.4 kg)     Height 07/07/19 1119 5' (1.524 m)     Head Circumference --      Peak Flow --      Pain Score 07/07/19 1119 5     Pain Loc --      Pain Edu? --      Excl. in GC? --    No data found.  Updated Vital Signs BP 118/74 (BP Location: Right Arm)   Pulse 97   Temp 98.6 F (37 C) (Oral)   Ht 5' (1.524 m)   Wt 93.4 kg   SpO2 99%   BMI 40.23 kg/m   Visual Acuity Right Eye Distance:   Left Eye Distance:   Bilateral Distance:    Right Eye Near:   Left Eye Near:    Bilateral Near:     Physical Exam Vitals signs and nursing note reviewed.  Constitutional:      Appearance: She is well-developed.  HENT:     Head: Normocephalic.     Right Ear: Tympanic membrane normal.     Left Ear: Tympanic membrane normal.     Mouth/Throat:  Pharynx: Pharyngeal swelling present.  Eyes:     Conjunctiva/sclera: Conjunctivae normal.  Neck:     Musculoskeletal: Normal range of motion.  Cardiovascular:     Rate and Rhythm: Normal rate.  Pulmonary:     Effort: Pulmonary effort is normal.  Abdominal:     General: Bowel sounds are normal. There is no distension.     Palpations: Abdomen is soft.  Musculoskeletal: Normal range of motion.  Skin:    General: Skin is warm.  Neurological:     General: No focal deficit present.     Mental Status: She is alert and oriented to person, place, and time.      UC Treatments / Results  Labs (all labs ordered are listed, but only abnormal results are displayed) Labs Reviewed  STREP A DNA PROBE  POCT RAPID STREP A (OFFICE)    EKG   Radiology No results found.  Procedures Procedures (including critical care time)  Medications Ordered in UC Medications - No data to display  Initial Impression / Assessment and Plan / UC Course  I have reviewed the triage vital signs and the nursing notes.  Pertinent labs & imaging results that were available during my care of the patient were reviewed by me and  considered in my medical decision making (see chart for details).     MDM Strep negative  Final Clinical Impressions(s) / UC Diagnoses   Final diagnoses:  Acute pharyngitis, unspecified etiology  Pharyngitis, unspecified etiology   Discharge Instructions   None    ED Prescriptions    None     PDMP not reviewed this encounter.  An After Visit Summary was printed and given to the patient.   Fransico Meadow, [place] 07/07/19 1154

## 2019-07-08 LAB — STREP A DNA PROBE: Group A Strep Probe: NOT DETECTED

## 2019-09-04 ENCOUNTER — Emergency Department
Admission: EM | Admit: 2019-09-04 | Discharge: 2019-09-04 | Disposition: A | Discharge status: Home / Self Care | Payer: Medicaid Other | Source: Home / Self Care

## 2019-09-04 ENCOUNTER — Other Ambulatory Visit: Payer: Self-pay

## 2019-09-04 DIAGNOSIS — J320 Chronic maxillary sinusitis: Secondary | ICD-10-CM | POA: Diagnosis not present

## 2019-09-04 DIAGNOSIS — R0981 Nasal congestion: Secondary | ICD-10-CM | POA: Diagnosis not present

## 2019-09-04 MED ORDER — CEFDINIR 300 MG PO CAPS: 300.0000 mg | ORAL_CAPSULE | Freq: Two times a day (BID) | ORAL | 0 refills | Status: AC

## 2019-09-04 NOTE — ED Provider Notes (Signed)
[facility]    CSN: [id number] Arrival date & time: 09/04/19  1312      History   Chief Complaint Chief Complaint  Patient presents with  . Jaw Pain    HPI Alisha Grant is a 39 y.o. female.   39 year old female, with history of ADD, anxiety, depression, presenting today complaining of suspected sinusitis.  Patient states that for the past 3 days or so, she has had congestion that is worse with lying flat.  States that it feels as though she has a toothache.  States that when she stands up, the congestion resolves and she then has postnasal drip.  States that she is had similar symptoms in the past and was treated for sinusitis.  States that she is currently [redacted] weeks pregnant and was unsure what she was able to take at home.  She denies any recent sick contacts or Covid exposure.  No loss of taste or smell.  The history is provided by the patient.  URI Presenting symptoms: congestion and facial pain   Presenting symptoms: no cough, no ear pain, no fatigue, no fever, no rhinorrhea and no sore throat   Severity:  Mild Onset quality:  Gradual Timing:  Constant Progression:  Unchanged Chronicity:  New Relieved by:  Nothing Worsened by:  Nothing Ineffective treatments:  None tried Associated symptoms: sinus pain   Associated symptoms: no arthralgias, no headaches, no myalgias, no neck pain, no sneezing, no swollen glands and no wheezing   Risk factors: not elderly, no chronic cardiac disease, no chronic kidney disease, no chronic respiratory disease, no diabetes mellitus, no immunosuppression, no recent illness, no recent travel and no sick contacts     Past Medical History:  Diagnosis Date  . ADD (attention deficit disorder)   . Anxiety and depression     There are no problems to display for this patient.   Past Surgical History:  Procedure Laterality Date  . CHOLECYSTECTOMY      OB History    Gravida  1   Para      Term      Preterm      AB        Living        SAB      TAB      Ectopic      Multiple      Live Births               Home Medications    Prior to Admission medications   Medication Sig Start Date End Date Taking? Authorizing Provider  Prenatal Multivit-Min-Fe-FA (PRE-NATAL FORMULA) TABS Take by mouth.   Yes [provider]  cefdinir (OMNICEF) 300 MG capsule Take 1 capsule (300 mg total) by mouth 2 (two) times daily for 10 days. 09/04/19 09/14/19  Kathlyne Loud C, PA-C  lisdexamfetamine (VYVANSE) 70 MG capsule Take 70 mg by mouth daily.    [provider]  sertraline (ZOLOFT) 100 MG tablet Take 100 mg by mouth daily.    [provider]    Family History Family History  Problem Relation Age of Onset  . Cancer Mother   . COPD Mother   . Heart attack Father     Social History Social History   Tobacco Use  . Smoking status: Smoker, Current Status Unknown    Packs/day: 1.00    Types: Cigarettes  . Smokeless tobacco: Never Used  . Tobacco comment: not currently  Substance Use Topics  . Alcohol  use: Yes  . Drug use: Not on file     Allergies   Codeine and Penicillins   Review of Systems Review of Systems  Constitutional: Negative for chills, fatigue and fever.  HENT: Positive for congestion and sinus pain. Negative for ear pain, rhinorrhea, sneezing and sore throat.   Eyes: Negative for pain and visual disturbance.  Respiratory: Negative for cough, shortness of breath and wheezing.   Cardiovascular: Negative for chest pain and palpitations.  Gastrointestinal: Negative for abdominal pain and vomiting.  Genitourinary: Negative for dysuria and hematuria.  Musculoskeletal: Negative for arthralgias, back pain, myalgias and neck pain.  Skin: Negative for color change and rash.  Neurological: Negative for seizures, syncope and headaches.  All other systems reviewed and are negative.    Physical Exam Triage Vital Signs ED Triage Vitals [09/04/19 1330]  Enc  Vitals Group     BP 118/77     Pulse Rate 88     Resp 18     Temp 97.9 F (36.6 C)     Temp Source Oral     SpO2 100 %     Weight 203 lb (92.1 kg)     Height 5' (1.524 m)     Head Circumference      Peak Flow      Pain Score      Pain Loc      Pain Edu?      Excl. in GC?    No data found.  Updated Vital Signs BP 118/77 (BP Location: Right Arm)   Pulse 88   Temp 97.9 F (36.6 C) (Oral)   Resp 18   Ht 5' (1.524 m)   Wt 203 lb (92.1 kg)   SpO2 100%   BMI 39.65 kg/m   Visual Acuity Right Eye Distance:   Left Eye Distance:   Bilateral Distance:    Right Eye Near:   Left Eye Near:    Bilateral Near:     Physical Exam Vitals and nursing note reviewed.  Constitutional:      General: She is not in acute distress.    Appearance: She is well-developed.  HENT:     Head: Normocephalic and atraumatic.     Nose:     Right Sinus: Maxillary sinus tenderness present.     Left Sinus: Maxillary sinus tenderness present.     Mouth/Throat:     Dentition: Abnormal dentition.     Pharynx: Oropharynx is clear.  Eyes:     Conjunctiva/sclera: Conjunctivae normal.  Cardiovascular:     Rate and Rhythm: Normal rate and regular rhythm.     Heart sounds: No murmur.  Pulmonary:     Effort: Pulmonary effort is normal. No respiratory distress.     Breath sounds: Normal breath sounds.  Abdominal:     Palpations: Abdomen is soft.     Tenderness: There is no abdominal tenderness.  Musculoskeletal:     Cervical back: Neck supple.  Skin:    General: Skin is warm and dry.  Neurological:     Mental Status: She is alert.      UC Treatments / Results  Labs (all labs ordered are listed, but only abnormal results are displayed) Labs Reviewed  NOVEL CORONAVIRUS, NAA    EKG   Radiology No results found.  Procedures Procedures (including critical care time)  Medications Ordered in UC Medications - No data to display  Initial Impression / Assessment and Plan / UC Course  I  have reviewed the  triage vital signs and the nursing notes.  Pertinent labs & imaging results that were available during my care of the patient were reviewed by me and considered in my medical decision making (see chart for details).     Suspected sinusitis based on exam and history.  Patient has a penicillin allergy but has taken cephalosporins in the past.  Will treat with Omnicef.  She is currently [redacted] weeks pregnant.  She was given a handout of medications that were safe during pregnancy as far as decongestants.  Send out Covid swab pending. Final Clinical Impressions(s) / UC Diagnoses   Final diagnoses:  Nasal congestion  Chronic maxillary sinusitis   Discharge Instructions   None    ED Prescriptions    Medication Sig Dispense Auth. Provider   cefdinir (OMNICEF) 300 MG capsule Take 1 capsule (300 mg total) by mouth 2 (two) times daily for 10 days. 20 capsule Keeghan Bialy C, PA-C     PDMP not reviewed this encounter.   Alecia Lemming, [place] 09/04/19 1402

## 2019-09-04 NOTE — ED Triage Notes (Signed)
Pt c/o upper lt jaw pain. Says it feels like a toothache, but last time she had similar pain, it ended up being a sinus infection. Pt is currently [redacted] weeks pregnant and not sure what she can take. Pain 5/10

## 2019-09-05 LAB — NOVEL CORONAVIRUS, NAA: SARS-CoV-2, NAA: NOT DETECTED

## 2019-09-29 DIAGNOSIS — O020 Blighted ovum and nonhydatidiform mole: Secondary | ICD-10-CM | POA: Insufficient documentation

## 2020-03-13 ENCOUNTER — Encounter: Payer: Self-pay | Admitting: Emergency Medicine

## 2020-03-13 ENCOUNTER — Other Ambulatory Visit: Payer: Self-pay

## 2020-03-13 ENCOUNTER — Emergency Department (INDEPENDENT_AMBULATORY_CARE_PROVIDER_SITE_OTHER)
Admission: EM | Admit: 2020-03-13 | Discharge: 2020-03-13 | Disposition: A | Discharge status: Home / Self Care | Payer: Medicaid Other | Source: Home / Self Care | Attending: Family Medicine | Admitting: Family Medicine

## 2020-03-13 DIAGNOSIS — K0889 Other specified disorders of teeth and supporting structures: Secondary | ICD-10-CM

## 2020-03-13 HISTORY — DX: Blighted ovum and nonhydatidiform mole: O02.0

## 2020-03-13 MED ORDER — CLINDAMYCIN HCL 300 MG PO CAPS: ORAL_CAPSULE | ORAL | 0 refills | Status: DC

## 2020-03-13 NOTE — ED Provider Notes (Signed)
[name]    CSN: [id number] Arrival date & time: 03/13/20  1503      History   Chief Complaint Chief Complaint  Patient presents with  . Facial Pain    HPI Alisha Grant is a 39 y.o. female.   Patient believes that she may have a sinus infection.  She complains of sinus congestion for about 4 days.  Yesterday she developed a pressure sensation in the left side of her face.  She has a painful broken left upper tooth.  She denies difficulty swallowing and fevers, chills, and sweats.  The history is provided by the patient.    Past Medical History:  Diagnosis Date  . ADD (attention deficit disorder)   . Anxiety and depression   . Molar pregnancy     There are no problems to display for this patient.   Past Surgical History:  Procedure Laterality Date  . CHOLECYSTECTOMY      OB History    Gravida  1   Para      Term      Preterm      AB      Living        SAB      TAB      Ectopic      Multiple      Live Births               Home Medications    Prior to Admission medications   Medication Sig Start Date End Date Taking? Authorizing Provider  escitalopram (LEXAPRO) 10 MG tablet Take 10 mg by mouth daily.   Yes [provider]  norethindrone-ethinyl estradiol (LOESTRIN FE) 1-20 MG-MCG tablet Take 1 tablet by mouth daily.   Yes [provider]  traZODone (DESYREL) 50 MG tablet Take 50 mg by mouth at bedtime.   Yes [provider]  clindamycin (CLEOCIN) 300 MG capsule Take one cap PO every 8 hours 03/13/20   Lattie Haw, MD  lisdexamfetamine (VYVANSE) 70 MG capsule Take 70 mg by mouth daily.    [provider]  Prenatal Multivit-Min-Fe-FA (PRE-NATAL FORMULA) TABS Take by mouth.    [provider]  sertraline (ZOLOFT) 100 MG tablet Take 100 mg by mouth daily.    [provider]    Family History Family History  Problem Relation Age of Onset  . Cancer Mother   .  COPD Mother   . Heart attack Father     Social History Social History   Tobacco Use  . Smoking status: Smoker, Current Status Unknown    Packs/day: 1.00    Types: Cigarettes  . Smokeless tobacco: Never Used  . Tobacco comment: not currently  Vaping Use  . Vaping Use: Every day  . Substances: Nicotine, Flavoring  Substance Use Topics  . Alcohol use: Yes  . Drug use: Not on file     Allergies   Codeine and Penicillins   Review of Systems Review of Systems  Constitutional: Negative for activity change, chills, diaphoresis, fatigue and fever.  HENT: Positive for congestion, dental problem, facial swelling, sinus pressure and sinus pain. Negative for ear pain, rhinorrhea, sore throat and trouble swallowing.   Eyes: Negative.   Respiratory: Negative.   Cardiovascular: Negative.   Gastrointestinal: Negative.   Genitourinary: Negative.   Musculoskeletal: Negative.   Skin: Negative.   Neurological: Negative.      Physical Exam Triage Vital Signs ED Triage Vitals  Enc Vitals Group  BP 03/13/20 1619 (!) 144/81     Pulse Rate 03/13/20 1619 84     Resp 03/13/20 1619 16     Temp 03/13/20 1619 98.5 F (36.9 C)     Temp Source 03/13/20 1619 Oral     SpO2 03/13/20 1619 98 %     Weight 03/13/20 1620 225 lb (102.1 kg)     Height 03/13/20 1620 5' (1.524 m)     Head Circumference --      Peak Flow --      Pain Score 03/13/20 1620 5     Pain Loc --      Pain Edu? --      Excl. in GC? --    No data found.  Updated Vital Signs BP (!) 144/81 (BP Location: Right Wrist)   Pulse 84   Temp 98.5 F (36.9 C) (Oral)   Resp 16   Ht 5' (1.524 m)   Wt 102.1 kg   SpO2 98%   Breastfeeding Unknown Comment: no period since February 2021; not pregnant as of 02/17/20  BMI 43.94 kg/m   Visual Acuity Right Eye Distance:   Left Eye Distance:   Bilateral Distance:    Right Eye Near:   Left Eye Near:    Bilateral Near:     Physical Exam Vitals and nursing note reviewed.    Constitutional:      General: She is not in acute distress. HENT:     Head: Normocephalic.     Right Ear: Tympanic membrane and ear canal normal.     Left Ear: Tympanic membrane and ear canal normal.     Nose: Nose normal.     Mouth/Throat:     Mouth: Mucous membranes are moist.     Dentition: Abnormal dentition. Dental tenderness, gingival swelling and dental caries present.     Pharynx: Oropharynx is clear.      Comments: Tooth #10 has tenderness to tap and tender inflamed surrounding gingiva. Eyes:     Conjunctiva/sclera: Conjunctivae normal.     Pupils: Pupils are equal, round, and reactive to light.  Cardiovascular:     Rate and Rhythm: Normal rate.  Pulmonary:     Effort: Pulmonary effort is normal.  Musculoskeletal:     Cervical back: Neck supple.  Lymphadenopathy:     Cervical: No cervical adenopathy.  Skin:    General: Skin is warm and dry.  Neurological:     Mental Status: She is alert and oriented to person, place, and time.      UC Treatments / Results  Labs (all labs ordered are listed, but only abnormal results are displayed) Labs Reviewed - No data to display  EKG   Radiology No results found.  Procedures Procedures (including critical care time)  Medications Ordered in UC Medications - No data to display  Initial Impression / Assessment and Plan / UC Course  I have reviewed the triage vital signs and the nursing notes.  Pertinent labs & imaging results that were available during my care of the patient were reviewed by me and considered in my medical decision making (see chart for details).    Suspect dental abscess; ?sinusitis.  Begin clindamycin 300mg  Q8hr.  Followup with dentist as soon as possible. Final Clinical Impressions(s) / UC Diagnoses   Final diagnoses:  Pain, dental     Discharge Instructions     May continue Ibuprofen 600mg  every 8 hours with food.   If symptoms become significantly worse during the night  or over the  weekend, proceed to the local emergency room.     ED Prescriptions    Medication Sig Dispense Auth. Provider   clindamycin (CLEOCIN) 300 MG capsule Take one cap PO every 8 hours 21 capsule Lattie Haw, MD        Lattie Haw, MD 03/20/20 [phone]

## 2020-03-13 NOTE — Discharge Instructions (Addendum)
May continue Ibuprofen 600mg  every 8 hours with food.   If symptoms become significantly worse during the night or over the weekend, proceed to the local emergency room.

## 2020-03-13 NOTE — ED Triage Notes (Signed)
Patient states she thinks she may have sinus infection: 4 days of congestion and now pressure left side of face; also has broken tooth on left upper back jaw area. Took ibuprofen 600 mg at 1400; also using heating pad. Has not had covid vaccination.

## 2020-05-05 ENCOUNTER — Other Ambulatory Visit: Payer: Self-pay

## 2020-05-05 ENCOUNTER — Emergency Department (INDEPENDENT_AMBULATORY_CARE_PROVIDER_SITE_OTHER)
Admission: RE | Admit: 2020-05-05 | Discharge: 2020-05-05 | Disposition: A | Discharge status: Home / Self Care | Payer: Medicaid Other | Source: Ambulatory Visit

## 2020-05-05 ENCOUNTER — Emergency Department (INDEPENDENT_AMBULATORY_CARE_PROVIDER_SITE_OTHER): Payer: Medicaid Other

## 2020-05-05 VITALS — BP 124/84 | HR 98 | Temp 99.1°F | Resp 17 | Ht 60.0 in | Wt 235.0 lb

## 2020-05-05 DIAGNOSIS — R0781 Pleurodynia: Secondary | ICD-10-CM

## 2020-05-05 DIAGNOSIS — S2231XA Fracture of one rib, right side, initial encounter for closed fracture: Secondary | ICD-10-CM | POA: Diagnosis not present

## 2020-05-05 MED ORDER — HYDROCODONE-ACETAMINOPHEN 5-325 MG PO TABS: 1.0000 | ORAL_TABLET | Freq: Four times a day (QID) | ORAL | 0 refills | Status: DC | PRN

## 2020-05-05 NOTE — ED Triage Notes (Signed)
R sided rib pain x 1 week Pt concerned she may have broken a rib  Min relief w/ ibuprofen (400mg   @ 1500) Can not recall an actual injury but states it may have been from playing w/ her dog who weighs 70 lbs NO COVID vaccine

## 2020-05-05 NOTE — ED Provider Notes (Signed)
[name]    CSN: [id number] Arrival date & time: 05/05/20  1848      History   Chief Complaint No chief complaint on file.   HPI Female Alisha Grant is a 39 y.o. female.   HPI Alisha Grant is a 39 y.o. female presenting to UC with c/o 1 week Right lower right pain. Pain is aching and sharp, worse with movement. No known injury but concerned about possible rib fracture from her 70lb dog.  Denies cough, congestion, fever, chills, n/v/d. She has taken ibuprofen with mild relief.     Past Medical History:  Diagnosis Date  . ADD (attention deficit disorder)   . Anxiety and depression   . Molar pregnancy     Patient Active Problem List   Diagnosis Date Noted  . 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 BMI 39.0-39.9,adult 06/17/2019  . Attention deficit disorder 04/15/2018  . GAD (generalized anxiety disorder) 04/15/2018  . Irritable bowel syndrome with diarrhea 04/15/2018  . Mild depression (HCC) 04/15/2018    Past Surgical History:  Procedure Laterality Date  . CHOLECYSTECTOMY    . HYSTEROSCOPY WITH D & C      OB History    Gravida  1   Para      Term      Preterm      AB      Living        SAB      TAB      Ectopic      Multiple      Live Births               Home Medications    Prior to Admission medications   Medication Sig Start Date End Date Taking? Authorizing Provider  escitalopram (LEXAPRO) 10 MG tablet Take 10 mg by mouth daily.   Yes [provider]  lisdexamfetamine (VYVANSE) 70 MG capsule Take 70 mg by mouth daily.   Yes [provider]  norethindrone-ethinyl estradiol (LOESTRIN FE) 1-20 MG-MCG tablet Take 1 tablet by mouth daily.   Yes [provider]  clindamycin (CLEOCIN) 300 MG capsule Take one cap PO every 8 hours 03/13/20   Lattie Haw, MD  HYDROcodone-acetaminophen (NORCO/VICODIN) 5-325 MG tablet Take 1 tablet by mouth every 6 (six) hours as needed. 05/05/20   Lurene Shadow,  PA-C  Prenatal Multivit-Min-Fe-FA (PRE-NATAL FORMULA) TABS Take by mouth.    [provider]  sertraline (ZOLOFT) 100 MG tablet Take 100 mg by mouth daily.    [provider]  traZODone (DESYREL) 50 MG tablet Take 50 mg by mouth at bedtime.    [provider]    Family History Family History  Problem Relation Age of Onset  . Cancer Mother   . COPD Mother   . Heart attack Father     Social History Social History   Tobacco Use  . Smoking status: Current Every Day Smoker    Packs/day: 1.00    Types: Cigarettes, E-cigarettes  . Smokeless tobacco: Never Used  . Tobacco comment: not currently  Vaping Use  . Vaping Use: Every day  . Substances: Nicotine, Flavoring  Substance Use Topics  . Alcohol use: Yes  . Drug use: Never     Allergies   Codeine and Penicillins   Review of Systems Review of Systems  Constitutional: Negative for chills and fever.  Respiratory: Negative for cough and shortness of breath.   Cardiovascular: Positive for chest pain (Right lower ribs).  Negative for palpitations and leg swelling.  Skin: Negative for color change, rash and wound.     Physical Exam Triage Vital Signs ED Triage Vitals  Enc Vitals Group     BP 05/05/20 1856 124/84     Pulse Rate 05/05/20 1856 98     Resp 05/05/20 1856 17     Temp 05/05/20 1856 99.1 F (37.3 C)     Temp Source 05/05/20 1856 Oral     SpO2 05/05/20 1856 98 %     Weight 05/05/20 1859 235 lb (106.6 kg)     Height 05/05/20 1859 5' (1.524 m)     Head Circumference --      Peak Flow --      Pain Score --      Pain Loc --      Pain Edu? --      Excl. in GC? --    No data found.  Updated Vital Signs BP 124/84 (BP Location: Right Arm)   Pulse 98   Temp 99.1 F (37.3 C) (Oral)   Resp 17   Ht 5' (1.524 m)   Wt 235 lb (106.6 kg)   LMP 04/26/2020 (Exact Date)   SpO2 98%   Breastfeeding No   BMI 45.90 kg/m   Visual Acuity Right Eye Distance:   Left Eye Distance:     Bilateral Distance:    Right Eye Near:   Left Eye Near:    Bilateral Near:     Physical Exam Vitals and nursing note reviewed.  Constitutional:      General: She is not in acute distress.    Appearance: Normal appearance. She is well-developed. She is obese. She is not ill-appearing, toxic-appearing or diaphoretic.  HENT:     Head: Normocephalic and atraumatic.  Cardiovascular:     Rate and Rhythm: Normal rate and regular rhythm.  Pulmonary:     Effort: Pulmonary effort is normal. No respiratory distress.     Breath sounds: Normal breath sounds. No stridor. No wheezing, rhonchi or rales.  Chest:     Chest wall: Tenderness present.    Musculoskeletal:        General: Normal range of motion.     Cervical back: Normal range of motion.  Skin:    General: Skin is warm and dry.     Findings: No bruising or erythema.  Neurological:     Mental Status: She is alert and oriented to person, place, and time.  Psychiatric:        Behavior: Behavior normal.      UC Treatments / Results  Labs (all labs ordered are listed, but only abnormal results are displayed) Labs Reviewed - No data to display  EKG   Radiology DG Ribs Unilateral W/Chest Right  Result Date: 05/05/2020 CLINICAL DATA:  Right-sided rib pain EXAM: RIGHT RIBS AND CHEST - 3+ VIEW COMPARISON:  None. FINDINGS: Single-view chest demonstrates no focal opacity or pleural effusion. Normal heart size. No pneumothorax. Right rib series demonstrates possible acute right ninth lateral rib fracture. IMPRESSION: 1. Negative for pneumothorax or pleural effusion. 2. Possible acute right ninth rib fracture. Electronically Signed   By: Jasmine Pang M.D.   On: 05/05/2020 19:26    Procedures Procedures (including critical care time)  Medications Ordered in UC Medications - No data to display  Initial Impression / Assessment and Plan / UC Course  I have reviewed the triage vital signs and the nursing notes.  Pertinent labs &  imaging results  that were available during my care of the patient were reviewed by me and considered in my medical decision making (see chart for details).    Discussed imaging with pt Encouraged symptomatic tx F/u with PCP as needed Discussed symptoms that warrant emergent care in the ED. AVS given  Final Clinical Impressions(s) / UC Diagnoses   Final diagnoses:  Rib pain on right side  Closed fracture of one rib of right side, initial encounter     Discharge Instructions      Norco/Vicodin (hydrocodone-acetaminophen) is a narcotic pain medication, do not combine these medications with others containing tylenol. While taking, do not drink alcohol, drive, or perform any other activities that requires focus while taking these medications.   Follow up with family medicine in 1-2 weeks as needed, sooner if worsening.     ED Prescriptions    Medication Sig Dispense Auth. Provider   HYDROcodone-acetaminophen (NORCO/VICODIN) 5-325 MG tablet Take 1 tablet by mouth every 6 (six) hours as needed. 10 tablet Lurene Shadow, PA-C     I have reviewed the PDMP during this encounter.   Lurene Shadow, [place] 05/05/20 1942

## 2020-05-05 NOTE — Discharge Instructions (Signed)
  Norco/Vicodin (hydrocodone-acetaminophen) is a narcotic pain medication, do not combine these medications with others containing tylenol. While taking, do not drink alcohol, drive, or perform any other activities that requires focus while taking these medications.   Follow up with family medicine in 1-2 weeks as needed, sooner if worsening.

## 2020-07-15 ENCOUNTER — Emergency Department
Admission: RE | Admit: 2020-07-15 | Discharge: 2020-07-15 | Disposition: A | Discharge status: Home / Self Care | Payer: Medicaid Other | Source: Ambulatory Visit

## 2020-07-15 ENCOUNTER — Other Ambulatory Visit: Payer: Self-pay

## 2020-07-15 VITALS — BP 119/83 | HR 90 | Temp 98.3°F | Resp 16 | Ht 60.0 in | Wt 240.0 lb

## 2020-07-15 DIAGNOSIS — J069 Acute upper respiratory infection, unspecified: Secondary | ICD-10-CM

## 2020-07-15 DIAGNOSIS — R0981 Nasal congestion: Secondary | ICD-10-CM

## 2020-07-15 MED ORDER — GUAIFENESIN ER 600 MG PO TB12: 600.0000 mg | ORAL_TABLET | Freq: Two times a day (BID) | ORAL | 0 refills | Status: DC | PRN

## 2020-07-15 MED ORDER — IPRATROPIUM BROMIDE 0.06 % NA SOLN: 2.0000 | Freq: Four times a day (QID) | NASAL | 1 refills | Status: DC

## 2020-07-15 NOTE — ED Provider Notes (Signed)
[name]    CSN: [id number] Arrival date & time: 07/15/20  0900      History   Chief Complaint Chief Complaint  Patient presents with  . Facial Pain    HPI Alisha Grant is a 39 y.o. female.   HPI  Alisha Grant is a 39 y.o. female presenting to UC with c/o sinus congestion, rhinorrhea, sinus pain, sore throat and itchy eyes for about 2 days.  She has taken OTC cold/sinus medications with minimal relief. Last dose was early this morning.  She notes her boyfriend has c/o similar symptoms including a bad headache and sore throat. Neither have been vaccinated for COVID.  Boyfriend was vaccinated for flu about 1 week ago. Pt has not had her flu vaccine. They plan to visit in-laws tomorrow.  Pt denies fever, chills, n/v/d. No chest pain or SOB. No loss of taste or smell.   Past Medical History:  Diagnosis Date  . ADD (attention deficit disorder)   . Anxiety and depression   . Molar pregnancy     Patient Active Problem List   Diagnosis Date Noted  . 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 BMI 39.0-39.9,adult 06/17/2019  . Attention deficit disorder 04/15/2018  . GAD (generalized anxiety disorder) 04/15/2018  . Irritable bowel syndrome with diarrhea 04/15/2018  . Mild depression (HCC) 04/15/2018    Past Surgical History:  Procedure Laterality Date  . CHOLECYSTECTOMY    . HYSTEROSCOPY WITH D & C      OB History    Gravida  1   Para      Term      Preterm      AB      Living        SAB      IAB      Ectopic      Multiple      Live Births               Home Medications    Prior to Admission medications   Medication Sig Start Date End Date Taking? Authorizing Provider  clindamycin (CLEOCIN) 300 MG capsule Take one cap PO every 8 hours 03/13/20   Lattie Haw, MD  escitalopram (LEXAPRO) 10 MG tablet Take 10 mg by mouth daily.    [provider]  guaiFENesin (MUCINEX) 600 MG 12 hr tablet Take 1-2 tablets (600-1,200  mg total) by mouth 2 (two) times daily as needed for cough or to loosen phlegm. 07/15/20   Lurene Shadow, PA-C  HYDROcodone-acetaminophen (NORCO/VICODIN) 5-325 MG tablet Take 1 tablet by mouth every 6 (six) hours as needed. 05/05/20   Lurene Shadow, PA-C  ipratropium (ATROVENT) 0.06 % nasal spray Place 2 sprays into both nostrils 4 (four) times daily. 07/15/20   Lurene Shadow, PA-C  lisdexamfetamine (VYVANSE) 70 MG capsule Take 70 mg by mouth daily.    [provider]  norethindrone-ethinyl estradiol (LOESTRIN FE) 1-20 MG-MCG tablet Take 1 tablet by mouth daily.    [provider]  Prenatal Multivit-Min-Fe-FA (PRE-NATAL FORMULA) TABS Take by mouth.    [provider]  sertraline (ZOLOFT) 100 MG tablet Take 100 mg by mouth daily.    [provider]  traZODone (DESYREL) 50 MG tablet Take 50 mg by mouth at bedtime.    [provider]    Family History Family History  Problem Relation Age of Onset  . Cancer Mother   . COPD Mother   . Heart attack Father  Social History Social History   Tobacco Use  . Smoking status: Current Every Day Smoker    Packs/day: 1.00    Types: E-cigarettes  . Smokeless tobacco: Never Used  . Tobacco comment: not currently  Vaping Use  . Vaping Use: Every day  . Substances: Nicotine, Flavoring  Substance Use Topics  . Alcohol use: Yes  . Drug use: Never     Allergies   Codeine and Penicillins   Review of Systems Review of Systems  Constitutional: Positive for fatigue. Negative for chills and fever.  HENT: Positive for congestion, postnasal drip, rhinorrhea, sinus pressure and sore throat. Negative for ear pain, trouble swallowing and voice change.   Eyes: Positive for itching. Negative for photophobia, pain, discharge, redness and visual disturbance.  Respiratory: Positive for cough (mild). Negative for shortness of breath.   Cardiovascular: Negative for chest pain and palpitations.   Gastrointestinal: Negative for abdominal pain, diarrhea, nausea and vomiting.  Musculoskeletal: Negative for arthralgias, back pain and myalgias.  Skin: Negative for rash.  Neurological: Positive for headaches. Negative for dizziness and light-headedness.  All other systems reviewed and are negative.    Physical Exam Triage Vital Signs ED Triage Vitals [07/15/20 0909]  Enc Vitals Group     BP      Pulse      Resp      Temp      Temp src      SpO2      Weight 240 lb (108.9 kg)     Height 5' (1.524 m)     Head Circumference      Peak Flow      Pain Score 0     Pain Loc      Pain Edu?      Excl. in GC?    No data found.  Updated Vital Signs BP 119/83 (BP Location: Right Arm)   Pulse 90   Temp 98.3 F (36.8 C) (Oral)   Resp 16   Ht 5' (1.524 m)   Wt 240 lb (108.9 kg)   SpO2 96%   BMI 46.87 kg/m   Visual Acuity Right Eye Distance:   Left Eye Distance:   Bilateral Distance:    Right Eye Near:   Left Eye Near:    Bilateral Near:     Physical Exam Vitals and nursing note reviewed.  Constitutional:      General: She is not in acute distress.    Appearance: Normal appearance. She is well-developed and well-nourished. She is not ill-appearing, toxic-appearing or diaphoretic.  HENT:     Head: Normocephalic and atraumatic.     Right Ear: Tympanic membrane and ear canal normal.     Left Ear: Tympanic membrane and ear canal normal.     Nose: Congestion present.     Right Sinus: No maxillary sinus tenderness or frontal sinus tenderness.     Left Sinus: No maxillary sinus tenderness or frontal sinus tenderness.     Mouth/Throat:     Lips: Pink.     Mouth: Mucous membranes are moist.     Pharynx: Oropharynx is clear. Uvula midline. No pharyngeal swelling, oropharyngeal exudate, posterior oropharyngeal erythema or uvula swelling.  Eyes:     Extraocular Movements: EOM normal.  Cardiovascular:     Rate and Rhythm: Normal rate and regular rhythm.  Pulmonary:      Effort: Pulmonary effort is normal. No respiratory distress.     Breath sounds: Normal breath sounds. No stridor. No wheezing, rhonchi or  rales.  Musculoskeletal:        General: Normal range of motion.     Cervical back: Normal range of motion and neck supple. No tenderness.  Lymphadenopathy:     Cervical: No cervical adenopathy.  Skin:    General: Skin is warm and dry.  Neurological:     Mental Status: She is alert and oriented to person, place, and time.  Psychiatric:        Mood and Affect: Mood and affect normal.        Behavior: Behavior normal.      UC Treatments / Results  Labs (all labs ordered are listed, but only abnormal results are displayed) Labs Reviewed  COVID-19, FLU A+B NAA    EKG   Radiology No results found.  Procedures Procedures (including critical care time)  Medications Ordered in UC Medications - No data to display  Initial Impression / Assessment and Plan / UC Course  I have reviewed the triage vital signs and the nursing notes.  Pertinent labs & imaging results that were available during my care of the patient were reviewed by me and considered in my medical decision making (see chart for details).     No evidence of bacterial infection at this time. COVID/Flu pending Encouraged symptomatic tx F/u with PCP as needed AVS given  Final Clinical Impressions(s) / UC Diagnoses   Final diagnoses:  Sinus congestion  Viral upper respiratory infection     Discharge Instructions     You may take 500mg  acetaminophen every 4-6 hours or in combination with ibuprofen 400-600mg  every 6-8 hours as needed for pain, inflammation, and fever.  Be sure to well hydrated with clear liquids and get at least 8 hours of sleep at night, preferably more while sick.   Please follow up with family medicine in 1 week if needed.  Due to concern for possibly having Covid-19, it is advised that you self-isolate at home until test results come back (1-3 days).   If positive, it is recommended you stay isolated for at least 10 days after symptom onset and 24 after last fever without taking medication (whichever is longer).  If you MUST go out, please wear a mask at all times, limit contact with others.      ED Prescriptions    Medication Sig Dispense Auth. Provider   ipratropium (ATROVENT) 0.06 % nasal spray Place 2 sprays into both nostrils 4 (four) times daily. 15 mL , Jacqualyn Sedgwick O, PA-C   guaiFENesin (MUCINEX) 600 MG 12 hr tablet Take 1-2 tablets (600-1,200 mg total) by mouth 2 (two) times daily as needed for cough or to loosen phlegm. 30 tablet [birth year]1982, Lurene Shadow     PDMP not reviewed this encounter.   [place], Lurene Shadow 07/15/20 1015

## 2020-07-15 NOTE — ED Triage Notes (Signed)
Pt presents with facial pain and runny nose sore throat and itchy eyes for 2 days. Patient states she has taken tylenol sinus with no improvement of symptoms and no fever.

## 2020-07-15 NOTE — Discharge Instructions (Signed)
You may take 500mg  acetaminophen every 4-6 hours or in combination with ibuprofen 400-600mg  every 6-8 hours as needed for pain, inflammation, and fever.  Be sure to well hydrated with clear liquids and get at least 8 hours of sleep at night, preferably more while sick.   Please follow up with family medicine in 1 week if needed.  Due to concern for possibly having Covid-19, it is advised that you self-isolate at home until test results come back (1-3 days).  If positive, it is recommended you stay isolated for at least 10 days after symptom onset and 24 after last fever without taking medication (whichever is longer).  If you MUST go out, please wear a mask at all times, limit contact with others.

## 2020-07-17 ENCOUNTER — Telehealth: Payer: Self-pay | Admitting: Emergency Medicine

## 2020-07-17 LAB — COVID-19, FLU A+B NAA
Influenza A, NAA: NOT DETECTED
Influenza B, NAA: NOT DETECTED
SARS-CoV-2, NAA: NOT DETECTED

## 2020-07-17 NOTE — Telephone Encounter (Signed)
Call from patient regarding need for antibiotics at 1105. Pt's COVID/Flu test from Friday resulted as negative this am. Her s/o tested positive for COVID the same day- unknown if he was tested at [facility]. Pt's symptoms started on Friday. RN returned call to patient after chart review w/ provider here today (Dr Delton See). Pt states she has sinus congestion, per dr Delton See, pt is to continue to treat w/ OTC meds and pt can retest for COVID via a drive thru site on Monday or Tuesday. Pt to go to hospital for SOB or low O2 sats, RN informed patient on how to purchase an O2 sat while remaining in quarantine. Pt &  S/O are NOT vaccinated against COVID

## 2020-11-18 IMAGING — DX DG KNEE AP/LAT W/ SUNRISE*R*
5 series · 5 of 5 positions shown · non-contrast
Comparison: None.

CLINICAL DATA: Tenderness and crepitation over the patellar region
of the right knee. Patient tripped over her dog and fell. Anterior
bruising.

EXAM:
RIGHT KNEE 3 VIEWS

[knee ap]
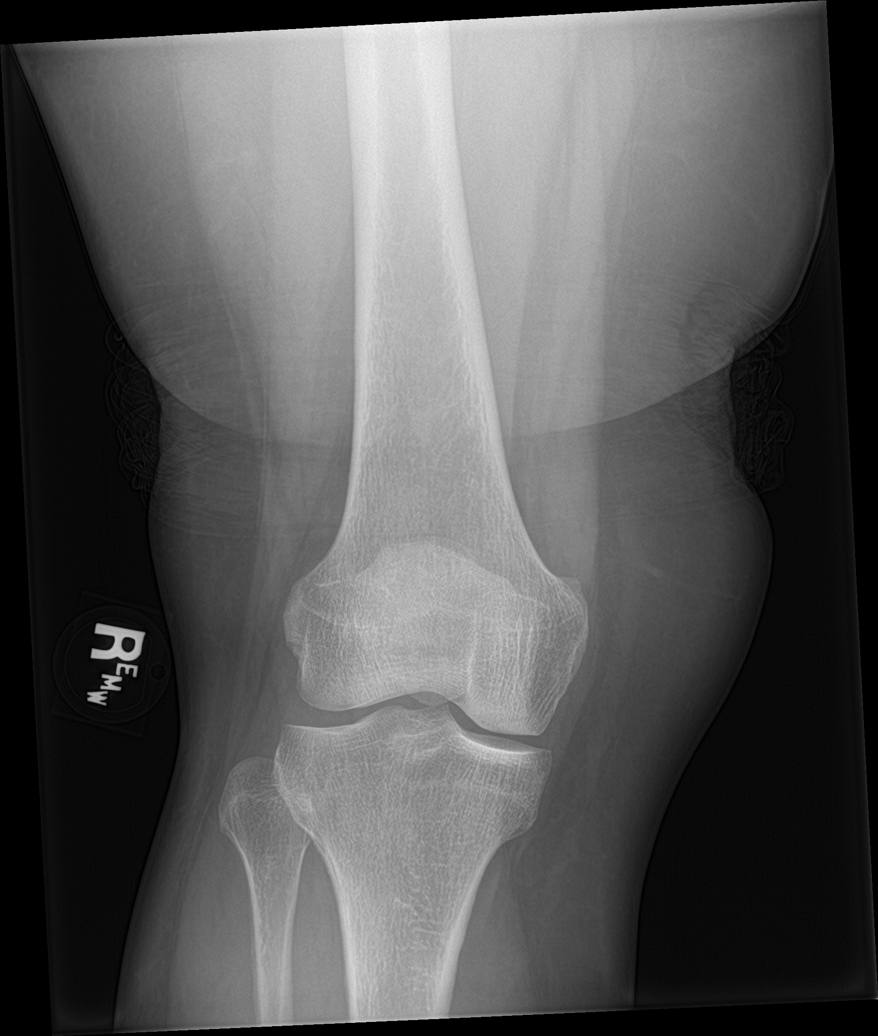

[knee lat (1 of 2)]
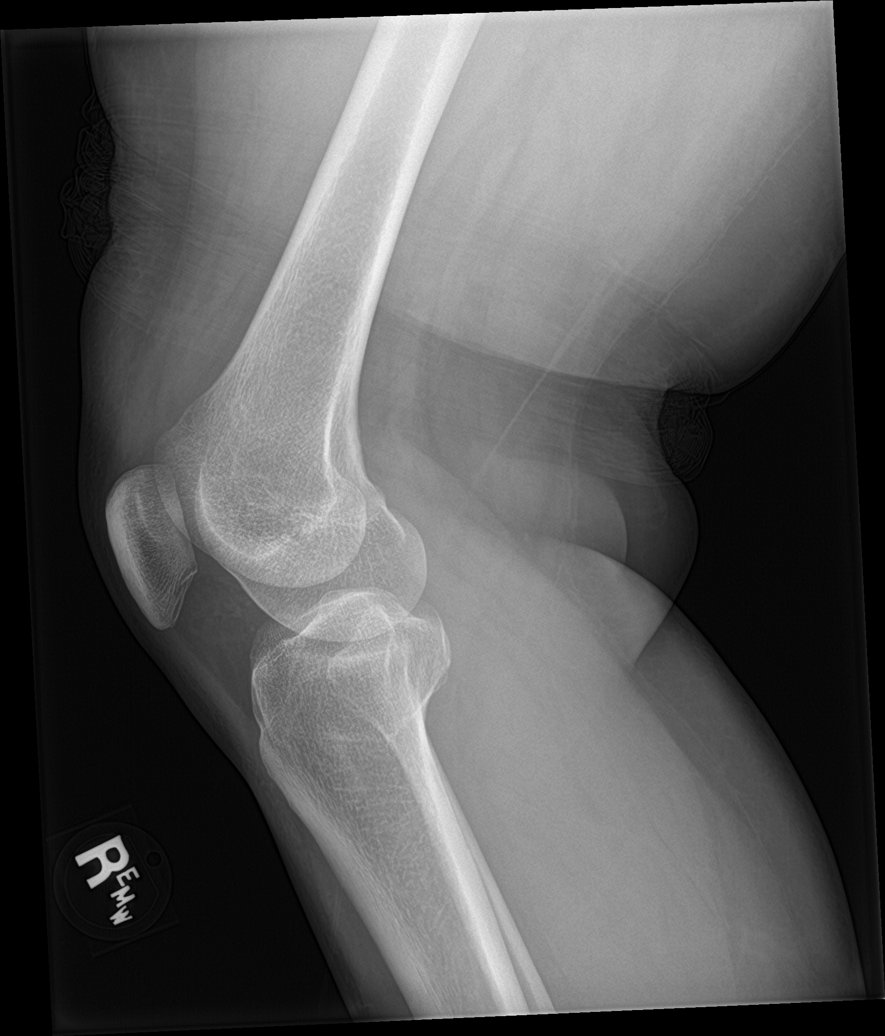

[knee sunrise]
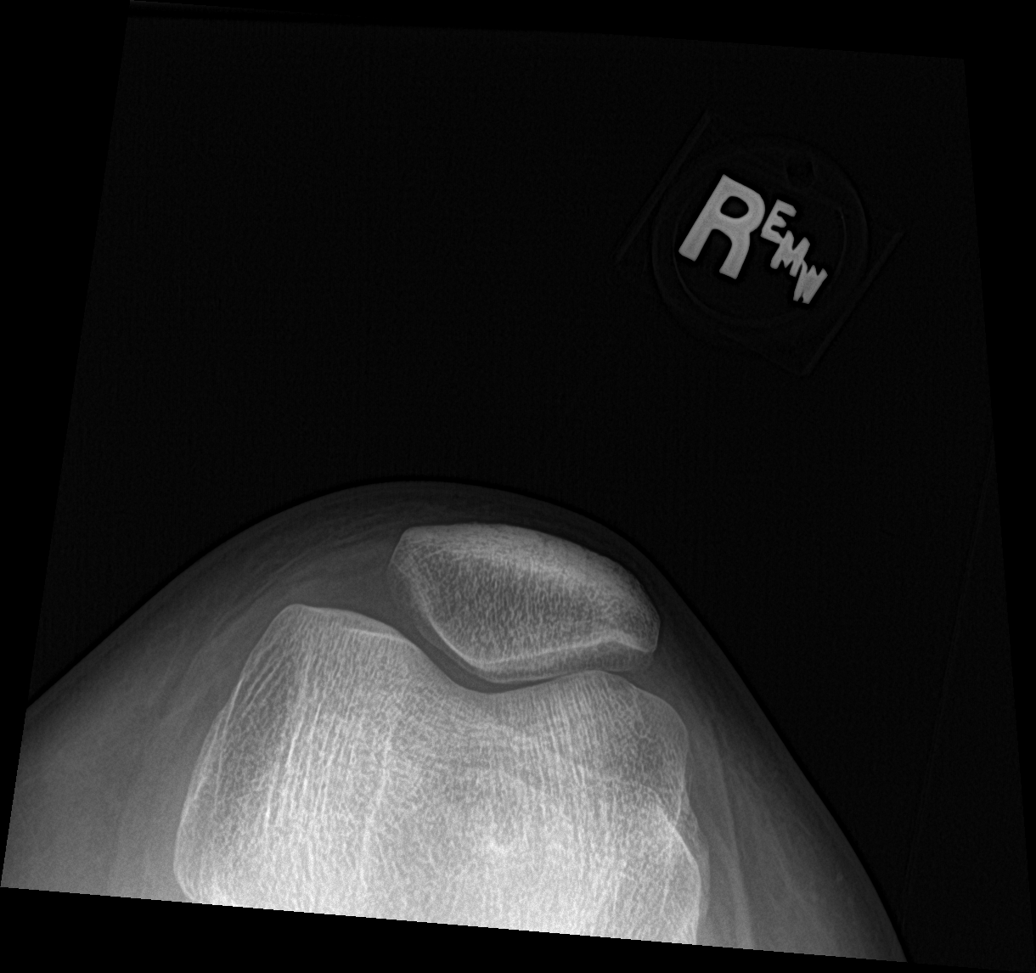

[tunnel]
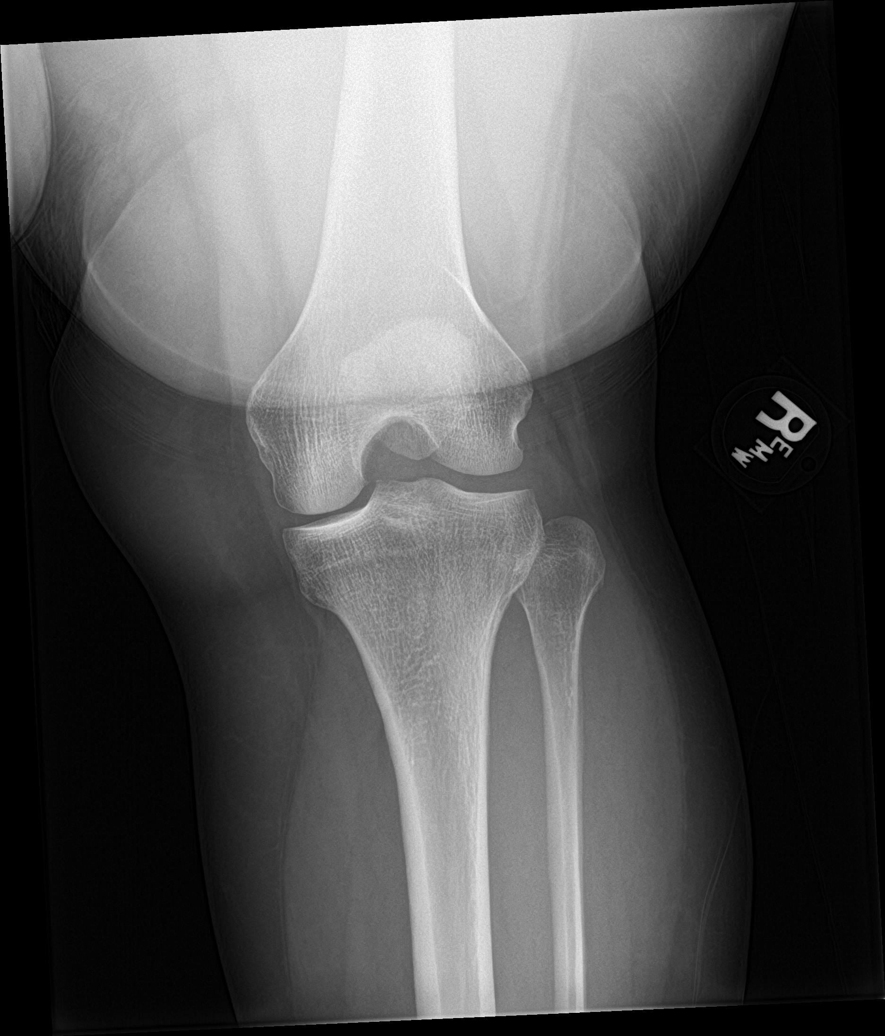

[knee lat (2 of 2)]
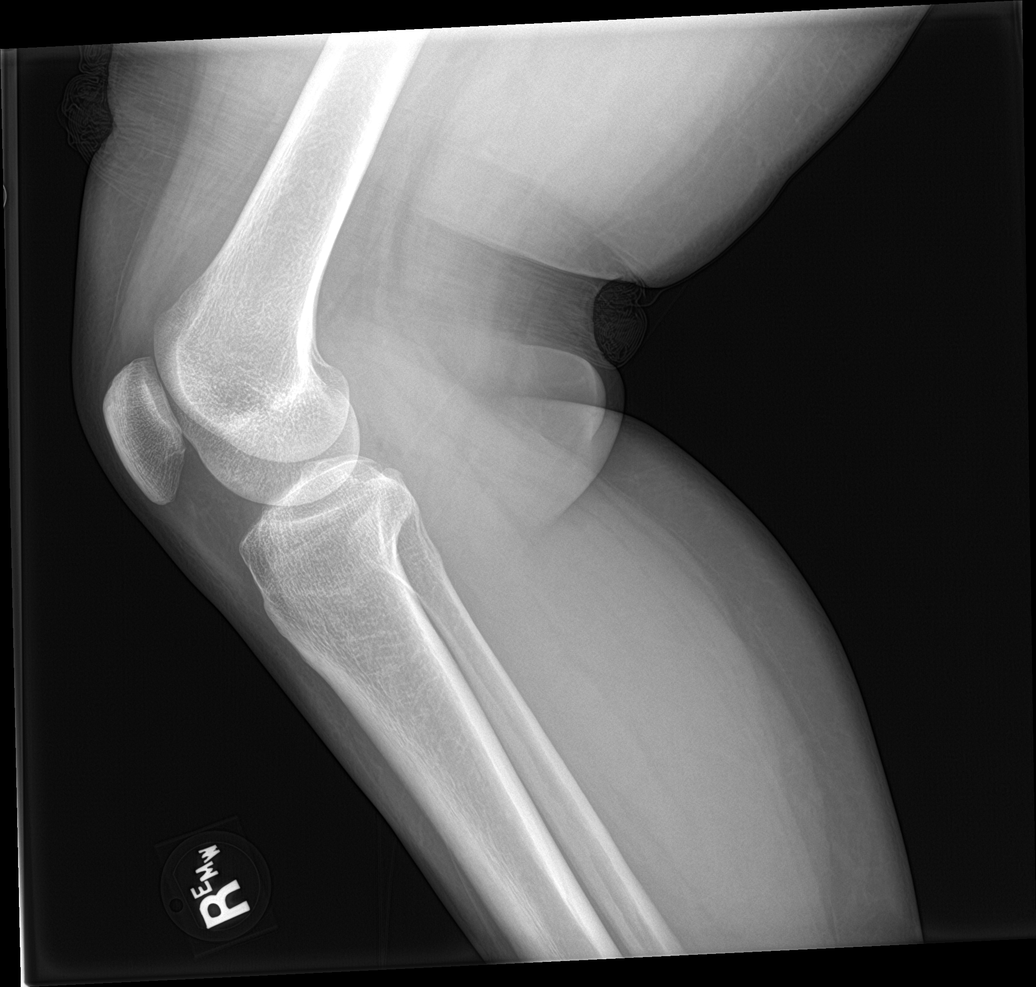

[5 of 5 positions shown; findings below may reference images not displayed]

FINDINGS: No fracture or bone lesion.

Joint normally spaced and aligned. No arthropathic changes. No joint
effusion.

Mild anterior subcutaneous soft tissue edema above the patella. No
other soft tissue abnormality.
IMPRESSION: No fracture or joint abnormality.

## 2022-06-17 ENCOUNTER — Ambulatory Visit
Admission: EM | Admit: 2022-06-17 | Discharge: 2022-06-17 | Disposition: A | Discharge status: Home / Self Care | Payer: Medicaid Other | Attending: Family Medicine | Admitting: Family Medicine

## 2022-06-17 ENCOUNTER — Other Ambulatory Visit: Payer: Self-pay

## 2022-06-17 DIAGNOSIS — R509 Fever, unspecified: Secondary | ICD-10-CM | POA: Diagnosis not present

## 2022-06-17 DIAGNOSIS — J111 Influenza due to unidentified influenza virus with other respiratory manifestations: Secondary | ICD-10-CM | POA: Diagnosis not present

## 2022-06-17 DIAGNOSIS — R059 Cough, unspecified: Secondary | ICD-10-CM | POA: Diagnosis not present

## 2022-06-17 MED ORDER — BENZONATATE 200 MG PO CAPS: 200.0000 mg | ORAL_CAPSULE | Freq: Three times a day (TID) | ORAL | 0 refills | Status: AC | PRN

## 2022-06-17 MED ORDER — OSELTAMIVIR PHOSPHATE 75 MG PO CAPS: 75.0000 mg | ORAL_CAPSULE | Freq: Two times a day (BID) | ORAL | 0 refills | Status: DC

## 2022-06-17 NOTE — ED Provider Notes (Signed)
[facility]    CSN: [id number] Arrival date & time: 06/17/22  0827      History   Chief Complaint Chief Complaint  Patient presents with   Cough   Generalized Body Aches   Sore Throat    HPI Alisha Grant is a 41 y.o. female.   HPI pleasant 41 year old female presents with cough, sore throat, body aches since yesterday.  Reports negative home COVID-19 test.  Patient reports that she has been exposed to influenza.  PMH significant for morbid obesity, GAD, and ADD.  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 Authorizing Provider  benzonatate (TESSALON) 200 MG capsule Take 1 capsule (200 mg total) by mouth 3 (three) times daily as needed for up to 7 days. 06/17/22 06/24/22 Yes Trevor Iha, FNP  clindamycin (CLEOCIN) 300 MG capsule Take one cap PO every 8 hours 03/13/20   Lattie Haw, MD  escitalopram (LEXAPRO) 10 MG tablet Take 10 mg by mouth daily.    [provider]  guaiFENesin (MUCINEX) 600 MG 12 hr tablet Take 1-2 tablets (600-1,200 mg total) by mouth 2 (two) times daily as needed for cough or to loosen phlegm. 07/15/20   Lurene Shadow, PA-C  HYDROcodone-acetaminophen (NORCO/VICODIN) 5-325  MG tablet Take 1 tablet by mouth every 6 (six) hours as needed. 05/05/20   Lurene Shadow, PA-C  ipratropium (ATROVENT) 0.06 % nasal spray Place 2 sprays into both nostrils 4 (four) times daily. 07/15/20   Lurene Shadow, PA-C  lisdexamfetamine (VYVANSE) 70 MG capsule Take 70 mg by mouth daily.    [provider]  norethindrone-ethinyl estradiol (LOESTRIN FE) 1-20 MG-MCG tablet Take 1 tablet by mouth daily.    [provider]  oseltamivir (TAMIFLU) 75 MG capsule Take 1 capsule (75 mg total) by mouth every 12 (twelve) hours. 06/17/22  Yes Trevor Iha, FNP  Prenatal Multivit-Min-Fe-FA (PRE-NATAL FORMULA) TABS Take by mouth.    [provider]  sertraline (ZOLOFT) 100 MG tablet Take 100 mg by mouth daily.    [provider]  traZODone (DESYREL) 50 MG tablet Take 50 mg by mouth at bedtime.    [provider]    Family History Family History  Problem Relation Age of Onset   Cancer Mother    COPD Mother    Heart attack Father     Social History  Social History   Tobacco Use   Smoking status: Former    Types: Cigarettes   Smokeless tobacco: Never   Tobacco comments:    not currently  Vaping Use   Vaping Use: Every day   Substances: Nicotine, Flavoring  Substance Use Topics   Alcohol use: Not Currently   Drug use: Never     Allergies   Codeine and Penicillins   Review of Systems Review of Systems  Constitutional:  Positive for fever.  HENT:  Positive for sore throat.   Respiratory:  Positive for cough.   Musculoskeletal:  Positive for myalgias.  All other systems reviewed and are negative.    Physical Exam Triage Vital Signs ED Triage Vitals  Enc Vitals Group     BP 06/17/22 0845 116/75     Pulse Rate 06/17/22 0845 99     Resp 06/17/22 0845 16     Temp 06/17/22 0845 100 F (37.8 C)     Temp Source 06/17/22 0845 Oral     SpO2 06/17/22 0845 98 %     Weight 06/17/22 0840 240 lb (108.9 kg)     Height 06/17/22 0840 5\' 1"   (1.549 m)     Head Circumference --      Peak Flow --      Pain Score 06/17/22 0840 4     Pain Loc --      Pain Edu? --      Excl. in GC? --    No data found.  Updated Vital Signs BP 116/75 (BP Location: Right Arm)   Pulse 99   Temp 100 F (37.8 C) (Oral)   Resp 16   Ht 5\' 1"  (1.549 m)   Wt 240 lb (108.9 kg)   LMP 05/20/2022 (Approximate)   SpO2 98%   BMI 45.35 kg/m      Physical Exam Vitals and nursing note reviewed.  Constitutional:      General: She is not in acute distress.    Appearance: She is well-developed. She is obese. She is ill-appearing.  HENT:     Head: Normocephalic and atraumatic.     Right Ear: Tympanic membrane and ear canal normal.     Left Ear: Tympanic membrane and ear canal normal.     Mouth/Throat:     Mouth: Mucous membranes are moist.     Pharynx: Oropharynx is clear. No posterior oropharyngeal erythema.  Eyes:     Conjunctiva/sclera: Conjunctivae normal.     Pupils: Pupils are equal, round, and reactive to light.  Cardiovascular:     Rate and Rhythm: Normal rate and regular rhythm.     Heart sounds: Normal heart sounds.  Pulmonary:     Effort: Pulmonary effort is normal.     Breath sounds: Normal breath sounds. No wheezing, rhonchi or rales.     Comments: Infrequent nonproductive cough noted on exam Musculoskeletal:     Cervical back: Normal range of motion and neck supple.  Skin:    General: Skin is warm and dry.  Neurological:     General: No focal deficit present.     Mental Status: She is alert and oriented to person, place, and time.      UC Treatments / Results  Labs (all labs ordered are listed, but only abnormal results are displayed) Labs Reviewed - No data to display  EKG   Radiology No results found.  Procedures Procedures (including critical care time)  Medications Ordered in UC Medications - No data to display  Initial Impression / Assessment and Plan / UC Course  I have reviewed the triage vital signs  and the nursing notes.  Pertinent labs & imaging results that were available during my care of the patient were reviewed by me and considered in my medical decision making (see chart for details).     MDM: 1.  Influenza-like illness-Rx'd Tamiflu; 2.  Cough-Rx'd Tessalon; 3.  Fever-advised OTC Tylenol. Advised patient to take medication as directed with food to completion.  Advised may take Tessalon daily or as needed for cough.  Advised patient may take OTC Tylenol 1000 to 4000 mg daily, as needed for fever.  Encouraged patient increase daily water intake to 64 ounces per day while taking these medications.  Advised if symptoms worsen and/or unresolved please follow-up PCP or here for further evaluation.  Patient discharged home, hemodynamically stable. Final Clinical Impressions(s) / UC Diagnoses   Final diagnoses:  Influenza-like illness  Cough, unspecified type  Fever, unspecified     Discharge Instructions      Advised patient to take medication as directed with food to completion.  Advised may take Tessalon daily or as needed for cough.  Advised patient may take OTC Tylenol 1000 to 4000 mg daily, as needed for fever.  Encouraged patient increase daily water intake to 64 ounces per day while taking these medications.  Advised if symptoms worsen and/or unresolved please follow-up PCP or here for further evaluation.     ED Prescriptions     Medication Sig Dispense Auth. Provider   oseltamivir (TAMIFLU) 75 MG capsule Take 1 capsule (75 mg total) by mouth every 12 (twelve) hours. 10 capsule Trevor Iha, FNP   benzonatate (TESSALON) 200 MG capsule Take 1 capsule (200 mg total) by mouth 3 (three) times daily as needed for up to 7 days. 40 capsule Trevor Iha, FNP      PDMP not reviewed this encounter.   Trevor Iha, FNP 06/17/22 [phone]

## 2022-06-17 NOTE — Discharge Instructions (Addendum)
Advised patient to take medication as directed with food to completion.  Advised may take Tessalon daily or as needed for cough.  Advised patient may take OTC Tylenol 1000 to 4000 mg daily, as needed for fever.  Encouraged patient increase daily water intake to 64 ounces per day while taking these medications.  Advised if symptoms worsen and/or unresolved please follow-up PCP or here for further evaluation.

## 2022-06-17 NOTE — ED Triage Notes (Signed)
Pt presents to Urgent Care with c/o cough, sore throat, and body aches since yesterday. Negative home COVID test. States she has been exposed to the flu.

## 2022-06-18 ENCOUNTER — Telehealth: Payer: Self-pay

## 2022-06-18 NOTE — Telephone Encounter (Signed)
LMTRC if any questions or concerns. 

## 2022-06-20 ENCOUNTER — Telehealth: Payer: Self-pay | Admitting: Emergency Medicine

## 2022-06-20 MED ORDER — AZITHROMYCIN 250 MG PO TABS: 250.0000 mg | ORAL_TABLET | Freq: Every day | ORAL | 0 refills | Status: DC

## 2022-06-20 MED ORDER — PREDNISONE 20 MG PO TABS: ORAL_TABLET | ORAL | 0 refills | Status: DC

## 2022-06-20 NOTE — Telephone Encounter (Signed)
Call back to [facility] to let her know that Casimiro Needle had sent in a z-pak & prednisone - RN reviewed dosing schedule with [place], no other questions at this time - ok to finish tamiflu prescription

## 2022-06-20 NOTE — Telephone Encounter (Signed)
Advised patient to take Zithromax and prednisone daily for the next 5 days.  Encouraged patient to increase daily water intake to 64 ounces per day.  Advised if symptoms worsen and/or unresolved please follow-up with PCP or here for further evaluation.

## 2022-06-20 NOTE — Telephone Encounter (Signed)
Call back from pt regarding ongoing sinus pressure, denies fever, per pt she is not feeling better and would like some other medicine. Provider updated - [facility] is the correct pharmacy. Will call patient back once provider reviews

## 2023-05-12 ENCOUNTER — Encounter: Payer: Self-pay | Admitting: Emergency Medicine

## 2023-05-12 ENCOUNTER — Ambulatory Visit
Admission: EM | Admit: 2023-05-12 | Discharge: 2023-05-12 | Disposition: A | Discharge status: Home / Self Care | Payer: Medicaid Other | Attending: Family Medicine | Admitting: Family Medicine

## 2023-05-12 DIAGNOSIS — J0141 Acute recurrent pansinusitis: Secondary | ICD-10-CM

## 2023-05-12 MED ORDER — IPRATROPIUM BROMIDE 0.03 % NA SOLN: 2.0000 | Freq: Two times a day (BID) | NASAL | 12 refills | Status: AC

## 2023-05-12 MED ORDER — PREDNISONE 50 MG PO TABS: ORAL_TABLET | ORAL | 0 refills | Status: AC

## 2023-05-12 MED ORDER — AZITHROMYCIN 250 MG PO TABS: ORAL_TABLET | ORAL | 0 refills | Status: AC

## 2023-05-12 NOTE — Discharge Instructions (Signed)
Drink lots of water  Take the azithromycin if you fail to see improvement over the next few days Take prednisone once a day for 5 days.  This will help with the congestion and discomfort Use the Atrovent nasal spray for your head congestion See your doctor if not improving by next week

## 2023-05-12 NOTE — ED Provider Notes (Signed)
[name]    CSN: [id number] Arrival date & time: 05/12/23  1452      History   Chief Complaint Chief Complaint  Patient presents with   Nasal Drainage    HPI Alisha Grant is a 42 y.o. female.   HPI  Patient is here for a "sinus infection".  She states she has some every year.  Patient states she has sinus congestion, nasal congestion, chest congestion and cough.  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 Authorizing Provider  azithromycin (ZITHROMAX Z-PAK) 250 MG tablet Take two pills today followed by one a day until gone 05/12/23  Yes Eustace Moore, MD  escitalopram (LEXAPRO) 10 MG tablet Take 10 mg by mouth daily.   Yes [provider]  ipratropium (ATROVENT) 0.03 % nasal spray Place 2 sprays into both nostrils every 12 (twelve) hours. 05/12/23  Yes Eustace Moore, MD  norethindrone-ethinyl estradiol (LOESTRIN FE) 1-20 MG-MCG tablet Take 1 tablet by mouth daily.   Yes [provider]  predniSONE (DELTASONE) 50 MG tablet Take once a day for 5 days.  Take with food 05/12/23  Yes Eustace Moore, MD    Family  History Family History  Problem Relation Age of Onset   Cancer Mother    COPD Mother    Heart attack Father     Social History Social History   Tobacco Use   Smoking status: Former    Types: Cigarettes   Smokeless tobacco: Never   Tobacco comments:    not currently  Vaping Use   Vaping status: Every Day   Substances: Nicotine, Flavoring  Substance Use Topics   Alcohol use: Not Currently   Drug use: Never     Allergies   Codeine and Penicillins   Review of Systems Review of Systems  See HPI Physical Exam Triage Vital Signs ED Triage Vitals  Encounter Vitals Group     BP 05/12/23 1513 122/83     Systolic BP Percentile --      Diastolic BP Percentile --      Pulse Rate 05/12/23 1513 80     Resp 05/12/23 1513 18     Temp 05/12/23 1513 98.2 F (36.8 C)     Temp Source 05/12/23 1513 Oral     SpO2 05/12/23 1513 99 %  Weight 05/12/23 1515 263 lb (119.3 kg)     Height 05/12/23 1515 5' (1.524 m)     Head Circumference --      Peak Flow --      Pain Score 05/12/23 1514 5     Pain Loc --      Pain Education --      Exclude from Growth Chart --    No data found.  Updated Vital Signs BP 122/83 (BP Location: Right Arm)   Pulse 80   Temp 98.2 F (36.8 C) (Oral)   Resp 18   Ht 5' (1.524 m)   Wt 119.3 kg   LMP 04/21/2023   SpO2 99%   BMI 51.36 kg/m       Physical Exam Constitutional:      General: She is not in acute distress.    Appearance: She is well-developed. She is obese. She is ill-appearing.  HENT:     Head: Normocephalic and atraumatic.     Right Ear: Tympanic membrane and ear canal normal.     Left Ear: Tympanic membrane and ear canal normal.     Nose: Congestion and rhinorrhea present.     Mouth/Throat:     Pharynx: Posterior oropharyngeal erythema present.  Eyes:     Conjunctiva/sclera: Conjunctivae normal.     Pupils: Pupils are equal, round, and reactive to light.  Cardiovascular:     Rate and Rhythm: Normal rate and regular  rhythm.     Heart sounds: Normal heart sounds.  Pulmonary:     Effort: Pulmonary effort is normal. No respiratory distress.     Breath sounds: Normal breath sounds.  Abdominal:     General: There is no distension.     Palpations: Abdomen is soft.  Musculoskeletal:        General: Normal range of motion.     Cervical back: Normal range of motion.  Lymphadenopathy:     Cervical: No cervical adenopathy.  Skin:    General: Skin is warm and dry.  Neurological:     Mental Status: She is alert.      UC Treatments / Results  Labs (all labs ordered are listed, but only abnormal results are displayed) Labs Reviewed - No data to display  EKG   Radiology No results found.  Procedures Procedures (including critical care time)  Medications Ordered in UC Medications - No data to display  Initial Impression / Assessment and Plan / UC Course  I have reviewed the triage vital signs and the nursing notes.  Pertinent labs & imaging results that were available during my care of the patient were reviewed by me and considered in my medical decision making (see chart for details).     Patient has congestion and clear rhinorrhea.  I explained that this is likely a viral illness.  I explained that antibiotics will not help her at this point.  Patient is quite insistent that she always needs antibiotics.  I am giving her prescription for a Z-Pak for her to fill and take if she fails to improve with conservative treatment.  I recommend she wait 7 to 10 days. Final Clinical Impressions(s) / UC Diagnoses   Final diagnoses:  Acute recurrent pansinusitis     Discharge Instructions      Drink lots of water  Take the azithromycin if you fail to see improvement over the next few days Take prednisone once a day for 5 days.  This will help with the congestion and discomfort  Use the Atrovent nasal spray for your head congestion See your doctor if not improving by next week   ED Prescriptions      Medication Sig Dispense Auth. Provider   azithromycin (ZITHROMAX Z-PAK) 250 MG tablet Take two pills today followed by one a day until gone 6 tablet Eustace Moore, MD   predniSONE (DELTASONE) 50 MG tablet Take once a day for 5 days.  Take with food 5 tablet Eustace Moore, MD   ipratropium (ATROVENT) 0.03 % nasal spray Place 2 sprays into both nostrils every 12 (twelve) hours. 30 mL Eustace Moore, MD      PDMP not reviewed this encounter.   Eustace Moore, MD 05/12/23 1556

## 2023-05-12 NOTE — ED Triage Notes (Signed)
Patient c/o nasal drainage, congestion, sinus pressure/headache, stopped up, possible sinus infection x 3-4 days.  Patient has taken OTC sinus medication w/o relief.

## 2023-05-13 ENCOUNTER — Ambulatory Visit: Payer: Medicaid Other
